# Patient Record
Sex: Female | Born: 1990 | Race: White | Hispanic: No | Marital: Married | Smoking: Current every day smoker
Health system: Southern US, Community
[De-identification: ages and names within clinical notes are randomized; demographics above are authoritative.]

## PROBLEM LIST (undated history)

## (undated) DIAGNOSIS — T7840XA Allergy, unspecified, initial encounter: Secondary | ICD-10-CM

## (undated) DIAGNOSIS — F419 Anxiety disorder, unspecified: Secondary | ICD-10-CM

## (undated) DIAGNOSIS — K219 Gastro-esophageal reflux disease without esophagitis: Secondary | ICD-10-CM

## (undated) DIAGNOSIS — J189 Pneumonia, unspecified organism: Secondary | ICD-10-CM

## (undated) DIAGNOSIS — F32A Depression, unspecified: Secondary | ICD-10-CM

## (undated) DIAGNOSIS — Z8701 Personal history of pneumonia (recurrent): Secondary | ICD-10-CM

## (undated) DIAGNOSIS — I499 Cardiac arrhythmia, unspecified: Secondary | ICD-10-CM

## (undated) DIAGNOSIS — I498 Other specified cardiac arrhythmias: Secondary | ICD-10-CM

## (undated) HISTORY — DX: Gastro-esophageal reflux disease without esophagitis: K21.9

## (undated) HISTORY — DX: Allergy, unspecified, initial encounter: T78.40XA

## (undated) HISTORY — DX: Depression, unspecified: F32.A

---
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
## 2016-03-29 DIAGNOSIS — R002 Palpitations: Secondary | ICD-10-CM | POA: Diagnosis not present

## 2016-03-29 DIAGNOSIS — R0789 Other chest pain: Secondary | ICD-10-CM | POA: Diagnosis not present

## 2016-03-29 DIAGNOSIS — R079 Chest pain, unspecified: Secondary | ICD-10-CM | POA: Diagnosis not present

## 2016-03-29 DIAGNOSIS — R071 Chest pain on breathing: Secondary | ICD-10-CM | POA: Diagnosis not present

## 2016-03-29 DIAGNOSIS — R5383 Other fatigue: Secondary | ICD-10-CM | POA: Diagnosis not present

## 2016-04-13 DIAGNOSIS — D72829 Elevated white blood cell count, unspecified: Secondary | ICD-10-CM | POA: Diagnosis not present

## 2016-04-13 DIAGNOSIS — R799 Abnormal finding of blood chemistry, unspecified: Secondary | ICD-10-CM | POA: Diagnosis not present

## 2016-05-10 DIAGNOSIS — R002 Palpitations: Secondary | ICD-10-CM | POA: Diagnosis not present

## 2016-08-10 DIAGNOSIS — R002 Palpitations: Secondary | ICD-10-CM | POA: Diagnosis not present

## 2016-08-11 DIAGNOSIS — R002 Palpitations: Secondary | ICD-10-CM | POA: Diagnosis not present

## 2016-10-25 DIAGNOSIS — M79672 Pain in left foot: Secondary | ICD-10-CM | POA: Diagnosis not present

## 2016-11-14 DIAGNOSIS — H9201 Otalgia, right ear: Secondary | ICD-10-CM | POA: Diagnosis not present

## 2016-12-28 DIAGNOSIS — D2239 Melanocytic nevi of other parts of face: Secondary | ICD-10-CM | POA: Diagnosis not present

## 2016-12-28 DIAGNOSIS — D225 Melanocytic nevi of trunk: Secondary | ICD-10-CM | POA: Diagnosis not present

## 2016-12-28 DIAGNOSIS — L821 Other seborrheic keratosis: Secondary | ICD-10-CM | POA: Diagnosis not present

## 2016-12-28 DIAGNOSIS — L820 Inflamed seborrheic keratosis: Secondary | ICD-10-CM | POA: Diagnosis not present

## 2017-02-27 DIAGNOSIS — J069 Acute upper respiratory infection, unspecified: Secondary | ICD-10-CM | POA: Diagnosis not present

## 2017-03-21 DIAGNOSIS — L820 Inflamed seborrheic keratosis: Secondary | ICD-10-CM | POA: Diagnosis not present

## 2017-03-22 DIAGNOSIS — Z01419 Encounter for gynecological examination (general) (routine) without abnormal findings: Secondary | ICD-10-CM | POA: Diagnosis not present

## 2017-05-23 DIAGNOSIS — J0100 Acute maxillary sinusitis, unspecified: Secondary | ICD-10-CM | POA: Diagnosis not present

## 2017-07-04 DIAGNOSIS — D2239 Melanocytic nevi of other parts of face: Secondary | ICD-10-CM | POA: Diagnosis not present

## 2017-07-04 DIAGNOSIS — D225 Melanocytic nevi of trunk: Secondary | ICD-10-CM | POA: Diagnosis not present

## 2017-07-04 DIAGNOSIS — L814 Other melanin hyperpigmentation: Secondary | ICD-10-CM | POA: Diagnosis not present

## 2017-12-26 DIAGNOSIS — R05 Cough: Secondary | ICD-10-CM | POA: Diagnosis not present

## 2017-12-26 DIAGNOSIS — J208 Acute bronchitis due to other specified organisms: Secondary | ICD-10-CM | POA: Diagnosis not present

## 2018-01-16 DIAGNOSIS — L814 Other melanin hyperpigmentation: Secondary | ICD-10-CM | POA: Diagnosis not present

## 2018-01-16 DIAGNOSIS — D2239 Melanocytic nevi of other parts of face: Secondary | ICD-10-CM | POA: Diagnosis not present

## 2018-01-16 DIAGNOSIS — D225 Melanocytic nevi of trunk: Secondary | ICD-10-CM | POA: Diagnosis not present

## 2018-01-16 DIAGNOSIS — D485 Neoplasm of uncertain behavior of skin: Secondary | ICD-10-CM | POA: Diagnosis not present

## 2018-01-19 DIAGNOSIS — D225 Melanocytic nevi of trunk: Secondary | ICD-10-CM | POA: Diagnosis not present

## 2018-01-30 DIAGNOSIS — D485 Neoplasm of uncertain behavior of skin: Secondary | ICD-10-CM | POA: Diagnosis not present

## 2018-03-14 DIAGNOSIS — D485 Neoplasm of uncertain behavior of skin: Secondary | ICD-10-CM | POA: Diagnosis not present

## 2018-04-18 DIAGNOSIS — J018 Other acute sinusitis: Secondary | ICD-10-CM | POA: Diagnosis not present

## 2018-05-16 DIAGNOSIS — J060 Acute laryngopharyngitis: Secondary | ICD-10-CM | POA: Diagnosis not present

## 2018-05-30 DIAGNOSIS — D485 Neoplasm of uncertain behavior of skin: Secondary | ICD-10-CM | POA: Diagnosis not present

## 2018-06-07 DIAGNOSIS — D485 Neoplasm of uncertain behavior of skin: Secondary | ICD-10-CM | POA: Diagnosis not present

## 2018-07-10 DIAGNOSIS — D225 Melanocytic nevi of trunk: Secondary | ICD-10-CM | POA: Diagnosis not present

## 2018-07-10 DIAGNOSIS — D2239 Melanocytic nevi of other parts of face: Secondary | ICD-10-CM | POA: Diagnosis not present

## 2018-07-10 DIAGNOSIS — L700 Acne vulgaris: Secondary | ICD-10-CM | POA: Diagnosis not present

## 2019-01-13 ENCOUNTER — Other Ambulatory Visit: Payer: Self-pay

## 2019-01-13 ENCOUNTER — Observation Stay (HOSPITAL_COMMUNITY)
Admission: EM | Admit: 2019-01-13 | Discharge: 2019-01-14 | Disposition: A | Discharge status: Home / Self Care | Payer: 59 | Attending: Surgery | Admitting: Surgery

## 2019-01-13 ENCOUNTER — Emergency Department (HOSPITAL_COMMUNITY): Payer: 59

## 2019-01-13 ENCOUNTER — Encounter (HOSPITAL_COMMUNITY): Payer: Self-pay

## 2019-01-13 DIAGNOSIS — K8010 Calculus of gallbladder with chronic cholecystitis without obstruction: Secondary | ICD-10-CM | POA: Diagnosis present

## 2019-01-13 DIAGNOSIS — F17210 Nicotine dependence, cigarettes, uncomplicated: Secondary | ICD-10-CM | POA: Insufficient documentation

## 2019-01-13 DIAGNOSIS — Z20828 Contact with and (suspected) exposure to other viral communicable diseases: Secondary | ICD-10-CM | POA: Insufficient documentation

## 2019-01-13 DIAGNOSIS — K8063 Calculus of gallbladder and bile duct with acute cholecystitis with obstruction: Secondary | ICD-10-CM

## 2019-01-13 DIAGNOSIS — R1011 Right upper quadrant pain: Secondary | ICD-10-CM | POA: Diagnosis present

## 2019-01-13 DIAGNOSIS — Z885 Allergy status to narcotic agent status: Secondary | ICD-10-CM | POA: Insufficient documentation

## 2019-01-13 DIAGNOSIS — Z881 Allergy status to other antibiotic agents status: Secondary | ICD-10-CM | POA: Diagnosis not present

## 2019-01-13 DIAGNOSIS — K819 Cholecystitis, unspecified: Secondary | ICD-10-CM

## 2019-01-13 DIAGNOSIS — K8012 Calculus of gallbladder with acute and chronic cholecystitis without obstruction: Secondary | ICD-10-CM | POA: Diagnosis not present

## 2019-01-13 HISTORY — DX: Anxiety disorder, unspecified: F41.9

## 2019-01-13 HISTORY — DX: Personal history of pneumonia (recurrent): Z87.01

## 2019-01-13 HISTORY — DX: Cardiac arrhythmia, unspecified: I49.9

## 2019-01-13 HISTORY — DX: Other specified cardiac arrhythmias: I49.8

## 2019-01-13 HISTORY — DX: Pneumonia, unspecified organism: J18.9

## 2019-01-13 LAB — LIPASE, BLOOD: Lipase: 24 U/L (ref 11–51)

## 2019-01-13 LAB — COMPREHENSIVE METABOLIC PANEL
ALT: 21 U/L (ref 0–44)
AST: 18 U/L (ref 15–41)
Albumin: 4 g/dL (ref 3.5–5.0)
Alkaline Phosphatase: 79 U/L (ref 38–126)
Anion gap: 13 (ref 5–15)
BUN: 11 mg/dL (ref 6–20)
CO2: 18 mmol/L — ABNORMAL LOW (ref 22–32)
Calcium: 9.5 mg/dL (ref 8.9–10.3)
Chloride: 108 mmol/L (ref 98–111)
Creatinine, Ser: 0.68 mg/dL (ref 0.44–1.00)
GFR calc Af Amer: >60 mL/min (ref >60)
GFR calc non Af Amer: >60 mL/min (ref >60)
Glucose, Bld: 101 mg/dL — ABNORMAL HIGH (ref 70–99)
Potassium: 4.2 mmol/L (ref 3.5–5.1)
Sodium: 139 mmol/L (ref 135–145)
Total Bilirubin: 0.4 mg/dL (ref 0.3–1.2)
Total Protein: 6.7 g/dL (ref 6.5–8.1)

## 2019-01-13 LAB — I-STAT BETA HCG BLOOD, ED (MC, WL, AP ONLY): I-stat hCG, quantitative: <5 m[IU]/mL (ref <5)

## 2019-01-13 LAB — CBC
HCT: 41.4 % (ref 36.0–46.0)
Hemoglobin: 13.8 g/dL (ref 12.0–15.0)
MCH: 30.6 pg (ref 26.0–34.0)
MCHC: 33.3 g/dL (ref 30.0–36.0)
MCV: 91.8 fL (ref 80.0–100.0)
Platelets: 345 10*3/uL (ref 150–400)
RBC: 4.51 MIL/uL (ref 3.87–5.11)
RDW: 13 % (ref 11.5–15.5)
WBC: 15.5 10*3/uL — ABNORMAL HIGH (ref 4.0–10.5)
nRBC: 0 % (ref 0.0–0.2)

## 2019-01-13 LAB — SURGICAL PCR SCREEN
MRSA, PCR: NEGATIVE
Staphylococcus aureus: NEGATIVE

## 2019-01-13 LAB — SARS CORONAVIRUS 2 (TAT 6-24 HRS): SARS Coronavirus 2: NEGATIVE

## 2019-01-13 MED ORDER — SODIUM CHLORIDE 0.9% FLUSH: 3.0000 mL | Freq: Once | INTRAVENOUS | Status: DC

## 2019-01-13 MED ORDER — ZOLPIDEM TARTRATE 5 MG PO TABS: 5.0000 mg | ORAL_TABLET | Freq: Every evening | ORAL | Status: DC | PRN

## 2019-01-13 MED ORDER — MORPHINE SULFATE (PF) 4 MG/ML IV SOLN: 4.0000 mg | INTRAVENOUS | Status: DC | PRN

## 2019-01-13 MED ORDER — ONDANSETRON 4 MG PO TBDP: 4.0000 mg | ORAL_TABLET | Freq: Four times a day (QID) | ORAL | Status: DC | PRN

## 2019-01-13 MED ORDER — PIPERACILLIN-TAZOBACTAM 3.375 G IVPB 30 MIN
3.3750 g | Freq: Once | INTRAVENOUS | Status: AC
  Administered 2019-01-13: 12:00:00 3.375 g via INTRAVENOUS
  Filled 2019-01-13: qty 50

## 2019-01-13 MED ORDER — KCL IN DEXTROSE-NACL 20-5-0.45 MEQ/L-%-% IV SOLN
INTRAVENOUS | Status: DC
  Administered 2019-01-13 (×2): via INTRAVENOUS
  Filled 2019-01-13 (×3): qty 1000

## 2019-01-13 MED ORDER — ACETAMINOPHEN 650 MG RE SUPP: 650.0000 mg | Freq: Four times a day (QID) | RECTAL | Status: DC | PRN

## 2019-01-13 MED ORDER — ACETAMINOPHEN 325 MG PO TABS: 650.0000 mg | ORAL_TABLET | Freq: Four times a day (QID) | ORAL | Status: DC | PRN

## 2019-01-13 MED ORDER — DIPHENHYDRAMINE HCL 50 MG/ML IJ SOLN: 25.0000 mg | Freq: Four times a day (QID) | INTRAMUSCULAR | Status: DC | PRN

## 2019-01-13 MED ORDER — ALPRAZOLAM 0.5 MG PO TABS
0.5000 mg | ORAL_TABLET | Freq: Three times a day (TID) | ORAL | Status: DC | PRN
  Administered 2019-01-13 (×2): 0.5 mg via ORAL
  Filled 2019-01-13 (×2): qty 1

## 2019-01-13 MED ORDER — DIPHENHYDRAMINE HCL 25 MG PO CAPS: 25.0000 mg | ORAL_CAPSULE | Freq: Four times a day (QID) | ORAL | Status: DC | PRN

## 2019-01-13 MED ORDER — ONDANSETRON HCL 4 MG/2ML IJ SOLN
4.0000 mg | Freq: Four times a day (QID) | INTRAMUSCULAR | Status: DC | PRN
  Administered 2019-01-14: 4 mg via INTRAVENOUS

## 2019-01-13 MED ORDER — CIPROFLOXACIN IN D5W 400 MG/200ML IV SOLN
400.0000 mg | Freq: Two times a day (BID) | INTRAVENOUS | Status: DC
  Administered 2019-01-13 – 2019-01-14 (×3): 400 mg via INTRAVENOUS
  Filled 2019-01-13 (×3): qty 200

## 2019-01-13 MED ORDER — IBUPROFEN 400 MG PO TABS: 400.0000 mg | ORAL_TABLET | Freq: Four times a day (QID) | ORAL | Status: DC | PRN

## 2019-01-13 NOTE — ED Provider Notes (Signed)
[facility] EMERGENCY DEPARTMENT Provider Note   CSN: [id number] Arrival date & time: 01/13/19  [id number]    History   Chief Complaint Gallbladder issue   HPI Yvonne Crawford is a 28 y.o. female with no significant past medical history who presents for evaluation of abdominal pain.  Patient states yesterday she developed chest and right-sided abdominal pain.  She was seen at [facility] yesterday night and dc this morning.  She was told that she could possibly have a thickened gallbladder wall, concern for cholecystitis.  Did not have ultrasound tech to perform an ultrasound at that time.  They asked her to return this morning for an official ultrasound to rule out cholecystitis.  Patient states she did not want to have surgery at [facility] so she presented here to the emergency department.  Patient states she did have an elevated white count of 17 at [facility].  Patient states her pain is been well controlled since she left the hospital.  Her current pain is a 2/10.  She denies fever, chills, nausea, vomiting, diarrhea, dysuria.  Denies additional aggravating or alleviating factors.  No prior abdominal surgeries Last p.o. intake was sip of water at 9 AM.  Last solid food yesterday evening.  History obtained from patient and past medical records.  No interpreter is used.     HPI  History reviewed. No pertinent past medical history.  Patient Active Problem List   Diagnosis Date Noted  . Cholecystitis with cholelithiasis 01/13/2019    History reviewed. No pertinent surgical history.   OB History   No obstetric history on file.      Home Medications    Prior to Admission medications   Not on File    Family History No family history on file.  Social History Social History   Tobacco Use  . Smoking status: Not on file  Substance Use Topics  . Alcohol use: Not on file  . Drug use: Not on file     Allergies   Patient has no  allergy information on record.   Review of Systems Review of Systems  Constitutional: Negative.   HENT: Negative.   Respiratory: Negative.   Cardiovascular: Negative.   Gastrointestinal: Positive for abdominal pain. Negative for abdominal distention, anal bleeding, blood in stool, constipation, diarrhea, nausea, rectal pain and vomiting.  Genitourinary: Negative.   Musculoskeletal: Negative.   Skin: Negative.   Neurological: Negative.   All other systems reviewed and are negative.    Physical Exam Updated Vital Signs BP 108/61   Pulse 65   Temp 98 F (36.7 C)   Resp 12   SpO2 97%   Physical Exam Vitals signs and nursing note reviewed.  Constitutional:      General: She is not in acute distress.    Appearance: She is well-developed. She is not ill-appearing or toxic-appearing.  HENT:     Head: Normocephalic and atraumatic.     Nose: Nose normal.     Mouth/Throat:     Mouth: Mucous membranes are moist.     Pharynx: Oropharynx is clear.  Eyes:     Pupils: Pupils are equal, round, and reactive to light.  Neck:     Musculoskeletal: Normal range of motion.  Cardiovascular:     Rate and Rhythm: Normal rate.     Pulses: Normal pulses.     Heart sounds: Normal heart sounds.  Pulmonary:     Effort: Pulmonary effort is normal. No respiratory distress.  Breath sounds: Normal breath sounds.  Abdominal:     General: Bowel sounds are normal. There is no distension.     Palpations: Abdomen is soft.     Tenderness: There is abdominal tenderness in the right upper quadrant and epigastric area. There is no right CVA tenderness, guarding or rebound. Negative signs include Murphy's sign.     Hernia: No hernia is present.     Comments: Soft, normoactive bowel sounds.  She does have some mild tenderness to her right upper quadrant however negative Murphy sign.  No rebound or guarding  Musculoskeletal: Normal range of motion.  Skin:    General: Skin is warm and dry.   Neurological:     Mental Status: She is alert.    ED Treatments / Results  Labs (all labs ordered are listed, but only abnormal results are displayed) Labs Reviewed  COMPREHENSIVE METABOLIC PANEL - Abnormal; Notable for the following components:      Result Value   CO2 18 (*)    Glucose, Bld 101 (*)    All other components within normal limits  CBC - Abnormal; Notable for the following components:   WBC 15.5 (*)    All other components within normal limits  SARS CORONAVIRUS 2 (TAT 6-24 HRS)  LIPASE, BLOOD  URINALYSIS, ROUTINE W REFLEX MICROSCOPIC  I-STAT BETA HCG BLOOD, ED ([facility], [facility], [facility] ONLY)    EKG None  Radiology US Abdomen Limited Ruq  Result Date: 01/13/2019 CLINICAL DATA:  Right upper quadrant pain since yesterday. EXAM: ULTRASOUND ABDOMEN LIMITED RIGHT UPPER QUADRANT COMPARISON:  None. FINDINGS: Gallbladder: Moderate cholelithiasis with largest stone measuring 1.6 cm. There is gallbladder wall thickening measuring 5.6 mm. Negative sonographic Murphy sign. No adjacent free fluid. Common bile duct: Diameter: 3.9 mm. Liver: No focal lesion identified. Within normal limits in parenchymal echogenicity. Portal vein is patent on color Doppler imaging with normal direction of blood flow towards the liver. Other: None. IMPRESSION: Moderate cholelithiasis with gallbladder wall thickening as these findings may be seen with acute cholecystitis as recommend clinical correlation. Electronically Signed   By: Marin Olp M.D.   On: 01/13/2019 10:56   Procedures Procedures (including critical care time)  Medications Ordered in ED Medications  sodium chloride flush (NS) 0.9 % injection 3 mL (0 mLs Intravenous Hold 01/13/19 1012)  piperacillin-tazobactam (ZOSYN) IVPB 3.375 g (3.375 g Intravenous New Bag/Given 01/13/19 1142)  morphine 4 MG/ML injection 4 mg (has no administration in time range)    Initial Impression / Assessment and Plan / ED Course  I have reviewed the triage vital signs  and the nursing notes.  Pertinent labs & imaging results that were available during my care of the patient were reviewed by me and considered in my medical decision making (see chart for details).  28 year old female appears otherwise well presents for evaluation abdominal pain.  Afebrile, nonseptic, non-ill-appearing.  Seen yesterday at [facility] and told she likely had cholecystitis however they cannot confirm this on her ultrasound.  They wanted her to return today for ultrasound however patient did not want surgery there so she presented to our emergency department.  She denies fever, chills.  Her pain was significantly relieved with the pain medicine prescribed at [facility] and her pain is actually currently only a 2/10 and she does not want any additional medicines.  She has a negative Murphy sign however does have some tenderness to her right upper quadrant.  She does have leukocytosis of 15.5.  Ultrasound does show  gallbladder wall thickening consistent with acute cholecystitis.    1108: Consulted with Dr. Grandville Silos with general surgery who will evaluate patient for admission. Plan to start IV abx. Plan for surgery tomorrow once COVID has resulted.  The patient appears reasonably stabilized for admission considering the current resources, flow, and capabilities available in the ED at this time, and I doubt any other [facility] requiring further screening and/or treatment in the ED prior to admission.       Final Clinical Impressions(s) / ED Diagnoses   Final diagnoses:  RUQ abdominal pain  Cholecystitis    ED Discharge Orders    None       Cieara Stierwalt A, PA-C 01/13/19 1211    Blanchie Dessert, MD 01/13/19 1442

## 2019-01-13 NOTE — ED Triage Notes (Signed)
Patient seen earlier this am at [facility] ED for epigastric pain. States that she was told gallbladder problem and unable to have offical u/s since radiology unavailable

## 2019-01-13 NOTE — ED Notes (Signed)
Patient transported to Ultrasound 

## 2019-01-13 NOTE — H&P (Signed)
Yvonne Crawford is an 28 y.o. female.   Chief Complaint: Right upper quadrant abdominal pain HPI: 28 year old female developed right upper quadrant abdominal pain overnight last night.  She went to [facility].  They suspected cholecystitis but they did not have ultrasound available.  If she was going to need surgery, she wanted that to be done at [facility] so she came to the [facility] emergency department this morning.  Work-up included ultrasound showing liver function test and lipase are normal, ultrasound shows gallstones and gallbladder wall thickening.  I was asked to see her for admission.  Her pain has gone down some since she received pain medication.  She denies recent illnesses or sick contacts.  History reviewed. No pertinent past medical history.  History reviewed. No pertinent surgical history.  No family history on file. Social History:  has no history on file for tobacco, alcohol, and drug.  Allergies: Not on File  (Not in a hospital admission)   Results for orders placed or performed during the hospital encounter of 01/13/19 (from the past 48 hour(s))  Lipase, blood     Status: None   Collection Time: 01/13/19  9:35 AM  Result Value Ref Range   Lipase 24 11 - 51 U/L    Comment: Performed at [facility], [address] [place], [place] [address]  Comprehensive metabolic panel     Status: Abnormal   Collection Time: 01/13/19  9:35 AM  Result Value Ref Range   Sodium 139 135 - 145 mmol/L   Potassium 4.2 3.5 - 5.1 mmol/L   Chloride 108 98 - 111 mmol/L   CO2 18 (L) 22 - 32 mmol/L   Glucose, Bld 101 (H) 70 - 99 mg/dL   BUN 11 6 - 20 mg/dL   Creatinine, Ser 0.68 0.44 - 1.00 mg/dL   Calcium 9.5 8.9 - 10.3 mg/dL   Total Protein 6.7 6.5 - 8.1 g/dL   Albumin 4.0 3.5 - 5.0 g/dL   AST 18 15 - 41 U/L   ALT 21 0 - 44 U/L   Alkaline Phosphatase 79 38 - 126 U/L   Total Bilirubin 0.4 0.3 - 1.2 mg/dL   GFR calc non Af Amer >60 >60 mL/min   GFR calc Af Amer >60 >60 mL/min    Anion gap 13 5 - 15    Comment: Performed at [facility], [place] [address], [place], [place] [address]  CBC     Status: Abnormal   Collection Time: 01/13/19  9:35 AM  Result Value Ref Range   WBC 15.5 (H) 4.0 - 10.5 K/uL   RBC 4.51 3.87 - 5.11 MIL/uL   Hemoglobin 13.8 12.0 - 15.0 g/dL   HCT 41.4 36.0 - 46.0 %   MCV 91.8 80.0 - 100.0 fL   MCH 30.6 26.0 - 34.0 pg   MCHC 33.3 30.0 - 36.0 g/dL   RDW 13.0 11.5 - 15.5 %   Platelets 345 150 - 400 K/uL   nRBC 0.0 0.0 - 0.2 %    Comment: Performed at [facility], [place] [address] [place], [place] [address]  I-Stat beta hCG blood, ED     Status: None   Collection Time: 01/13/19  9:52 AM  Result Value Ref Range   I-stat hCG, quantitative <5.0 <5 mIU/mL   Comment 3            Comment:   GEST. AGE      CONC.  (mIU/mL)   <=1  WEEK        5 - 50     2 WEEKS       50 - 500     3 WEEKS       100 - 10,000     4 WEEKS     1,000 - 30,000        FEMALE AND NON-PREGNANT FEMALE:     LESS THAN 5 mIU/mL    US Abdomen Limited Ruq  Result Date: 01/13/2019 CLINICAL DATA:  Right upper quadrant pain since yesterday. EXAM: ULTRASOUND ABDOMEN LIMITED RIGHT UPPER QUADRANT COMPARISON:  None. FINDINGS: Gallbladder: Moderate cholelithiasis with largest stone measuring 1.6 cm. There is gallbladder wall thickening measuring 5.6 mm. Negative sonographic Murphy sign. No adjacent free fluid. Common bile duct: Diameter: 3.9 mm. Liver: No focal lesion identified. Within normal limits in parenchymal echogenicity. Portal vein is patent on color Doppler imaging with normal direction of blood flow towards the liver. Other: None. IMPRESSION: Moderate cholelithiasis with gallbladder wall thickening as these findings may be seen with acute cholecystitis as recommend clinical correlation. Electronically Signed   By: Marin Olp M.D.   On: 01/13/2019 10:56    Review of Systems  Constitutional: Negative.   HENT: Negative.   Eyes: Negative.   Respiratory: Negative  for cough and shortness of breath.   Cardiovascular: Negative for chest pain.  Gastrointestinal: Positive for abdominal pain and nausea. Negative for constipation, diarrhea and vomiting.  Genitourinary: Negative.   Musculoskeletal: Negative.   Skin: Negative.   Neurological: Negative.   Endo/Heme/Allergies: Negative.   Psychiatric/Behavioral: Negative.     Blood pressure 116/82, pulse 75, temperature 98 F (36.7 C), resp. rate 12, SpO2 98 %. Physical Exam  Constitutional: She is oriented to person, place, and time. She appears well-developed and well-nourished. No distress.  HENT:  Head: Normocephalic.  Left Ear: External ear normal.  Nose: Nose normal.  Mouth/Throat: Oropharynx is clear and moist.  Eyes: Pupils are equal, round, and reactive to light. No scleral icterus.  Neck: Neck supple. No tracheal deviation present.  Cardiovascular: Normal rate, regular rhythm and normal heart sounds.  Respiratory: Effort normal and breath sounds normal. No respiratory distress. She has no wheezes. She has no rales.  GI: Soft. She exhibits no distension. There is abdominal tenderness. There is no rebound and no guarding.  Mild tenderness right upper quadrant  Musculoskeletal: Normal range of motion.        General: No edema.  Neurological: She is alert and oriented to person, place, and time.  Skin: Skin is warm.  Psychiatric: She has a normal mood and affect.     Assessment/Plan Cholecystitis with cholelithiasis -we will admit, check Covid test, will give IV Cipro as she reports an allergy to Rocephin.  Allow clears now and n.p.o. at midnight.  Plan laparoscopic cholecystectomy with possible cholangiogram tomorrow by Dr. Brantley Stage.  I discussed the procedure, risks, and benefits in detail with her.  She is agreeable.  Zenovia Jarred, MD 01/13/2019, 11:46 AM

## 2019-01-13 NOTE — ED Triage Notes (Signed)
PT was Dx with Gall stones at [facility] last night. Pt was instructed to return this AM when There was a radiologist present to read CT . Pt reported she may need surgery and wanted to come to [facility] surgery was needed.

## 2019-01-14 ENCOUNTER — Observation Stay (HOSPITAL_COMMUNITY): Payer: 59 | Admitting: Anesthesiology

## 2019-01-14 ENCOUNTER — Observation Stay (HOSPITAL_COMMUNITY): Payer: 59

## 2019-01-14 ENCOUNTER — Encounter (HOSPITAL_COMMUNITY): Payer: Self-pay | Admitting: *Deleted

## 2019-01-14 ENCOUNTER — Encounter (HOSPITAL_COMMUNITY)
Admission: EM | Disposition: A | Discharge status: Home / Self Care | Payer: Self-pay | Source: Home / Self Care | Attending: Emergency Medicine

## 2019-01-14 HISTORY — PX: CHOLECYSTECTOMY: SHX55

## 2019-01-14 HISTORY — PX: INTRAOPERATIVE CHOLANGIOGRAM: SHX5230

## 2019-01-14 LAB — CBC
HCT: 38.9 % (ref 36.0–46.0)
Hemoglobin: 12.7 g/dL (ref 12.0–15.0)
MCH: 30.7 pg (ref 26.0–34.0)
MCHC: 32.6 g/dL (ref 30.0–36.0)
MCV: 94 fL (ref 80.0–100.0)
Platelets: 287 10*3/uL (ref 150–400)
RBC: 4.14 MIL/uL (ref 3.87–5.11)
RDW: 13.2 % (ref 11.5–15.5)
WBC: 10.6 10*3/uL — ABNORMAL HIGH (ref 4.0–10.5)
nRBC: 0 % (ref 0.0–0.2)

## 2019-01-14 LAB — BASIC METABOLIC PANEL
Anion gap: 8 (ref 5–15)
BUN: <5 mg/dL — ABNORMAL LOW (ref 6–20)
CO2: 23 mmol/L (ref 22–32)
Calcium: 8.8 mg/dL — ABNORMAL LOW (ref 8.9–10.3)
Chloride: 107 mmol/L (ref 98–111)
Creatinine, Ser: 0.69 mg/dL (ref 0.44–1.00)
GFR calc Af Amer: >60 mL/min (ref >60)
GFR calc non Af Amer: >60 mL/min (ref >60)
Glucose, Bld: 109 mg/dL — ABNORMAL HIGH (ref 70–99)
Potassium: 4.2 mmol/L (ref 3.5–5.1)
Sodium: 138 mmol/L (ref 135–145)

## 2019-01-14 LAB — HIV ANTIBODY (ROUTINE TESTING W REFLEX): HIV Screen 4th Generation wRfx: NONREACTIVE

## 2019-01-14 SURGERY — LAPAROSCOPIC CHOLECYSTECTOMY WITH INTRAOPERATIVE CHOLANGIOGRAM
Anesthesia: General | Site: Abdomen

## 2019-01-14 MED ORDER — HYDROMORPHONE HCL 1 MG/ML IJ SOLN
INTRAMUSCULAR | Status: AC
  Filled 2019-01-14: qty 1

## 2019-01-14 MED ORDER — 0.9 % SODIUM CHLORIDE (POUR BTL) OPTIME
TOPICAL | Status: DC | PRN
  Administered 2019-01-14: 09:00:00 1000 mL

## 2019-01-14 MED ORDER — HYDROMORPHONE HCL 1 MG/ML IJ SOLN
INTRAMUSCULAR | Status: AC
  Filled 2019-01-14: qty 0.5

## 2019-01-14 MED ORDER — ONDANSETRON HCL 4 MG/2ML IJ SOLN
INTRAMUSCULAR | Status: AC
  Filled 2019-01-14: qty 2

## 2019-01-14 MED ORDER — MEPERIDINE HCL 25 MG/ML IJ SOLN: 6.2500 mg | INTRAMUSCULAR | Status: DC | PRN

## 2019-01-14 MED ORDER — SUGAMMADEX SODIUM 200 MG/2ML IV SOLN
INTRAVENOUS | Status: DC | PRN
  Administered 2019-01-14: 190 mg via INTRAVENOUS

## 2019-01-14 MED ORDER — LIDOCAINE 2% (20 MG/ML) 5 ML SYRINGE
INTRAMUSCULAR | Status: DC | PRN
  Administered 2019-01-14: 100 mg via INTRAVENOUS

## 2019-01-14 MED ORDER — DEXAMETHASONE SODIUM PHOSPHATE 10 MG/ML IJ SOLN
INTRAMUSCULAR | Status: AC
  Filled 2019-01-14: qty 1

## 2019-01-14 MED ORDER — TRAMADOL HCL 50 MG PO TABS: 50.0000 mg | ORAL_TABLET | Freq: Four times a day (QID) | ORAL | Status: DC | PRN

## 2019-01-14 MED ORDER — DEXAMETHASONE SODIUM PHOSPHATE 10 MG/ML IJ SOLN
INTRAMUSCULAR | Status: DC | PRN
  Administered 2019-01-14: 4 mg via INTRAVENOUS

## 2019-01-14 MED ORDER — PROPOFOL 10 MG/ML IV BOLUS
INTRAVENOUS | Status: AC
  Filled 2019-01-14: qty 20

## 2019-01-14 MED ORDER — HYDROMORPHONE HCL 1 MG/ML IJ SOLN
INTRAMUSCULAR | Status: DC | PRN
  Administered 2019-01-14 (×2): 0.5 mg via INTRAVENOUS

## 2019-01-14 MED ORDER — OXYCODONE HCL 5 MG PO TABS: 5.0000 mg | ORAL_TABLET | Freq: Four times a day (QID) | ORAL | 0 refills | Status: DC | PRN

## 2019-01-14 MED ORDER — LIDOCAINE 2% (20 MG/ML) 5 ML SYRINGE
INTRAMUSCULAR | Status: AC
  Filled 2019-01-14: qty 5

## 2019-01-14 MED ORDER — ROCURONIUM BROMIDE 10 MG/ML (PF) SYRINGE
PREFILLED_SYRINGE | INTRAVENOUS | Status: AC
  Filled 2019-01-14: qty 10

## 2019-01-14 MED ORDER — OXYCODONE HCL 5 MG PO TABS
5.0000 mg | ORAL_TABLET | ORAL | Status: DC | PRN
  Administered 2019-01-14: 10 mg via ORAL
  Filled 2019-01-14: qty 2

## 2019-01-14 MED ORDER — LACTATED RINGERS IV SOLN
Freq: Once | INTRAVENOUS | Status: AC
  Administered 2019-01-14: 08:00:00 via INTRAVENOUS

## 2019-01-14 MED ORDER — MORPHINE SULFATE (PF) 2 MG/ML IV SOLN
2.0000 mg | INTRAVENOUS | Status: DC | PRN
  Administered 2019-01-14: 2 mg via INTRAVENOUS

## 2019-01-14 MED ORDER — STERILE WATER FOR IRRIGATION IR SOLN
Status: DC | PRN
  Administered 2019-01-14: 1000 mL

## 2019-01-14 MED ORDER — PROMETHAZINE HCL 25 MG/ML IJ SOLN
INTRAMUSCULAR | Status: AC
  Filled 2019-01-14: qty 1

## 2019-01-14 MED ORDER — ENOXAPARIN SODIUM 40 MG/0.4ML ~~LOC~~ SOLN: 40.0000 mg | SUBCUTANEOUS | Status: DC

## 2019-01-14 MED ORDER — SODIUM CHLORIDE 0.9 % IV SOLN
INTRAVENOUS | Status: DC | PRN
  Administered 2019-01-14: 15 mL

## 2019-01-14 MED ORDER — BUPIVACAINE-EPINEPHRINE 0.5% -1:200000 IJ SOLN
INTRAMUSCULAR | Status: AC
  Filled 2019-01-14: qty 1

## 2019-01-14 MED ORDER — FENTANYL CITRATE (PF) 250 MCG/5ML IJ SOLN
INTRAMUSCULAR | Status: DC | PRN
  Administered 2019-01-14: 50 ug via INTRAVENOUS
  Administered 2019-01-14: 25 ug via INTRAVENOUS
  Administered 2019-01-14: 100 ug via INTRAVENOUS

## 2019-01-14 MED ORDER — PROPOFOL 10 MG/ML IV BOLUS
INTRAVENOUS | Status: DC | PRN
  Administered 2019-01-14: 200 mg via INTRAVENOUS

## 2019-01-14 MED ORDER — ACETAMINOPHEN 325 MG PO TABS: 650.0000 mg | ORAL_TABLET | Freq: Four times a day (QID) | ORAL | Status: DC | PRN

## 2019-01-14 MED ORDER — LACTATED RINGERS IV SOLN
INTRAVENOUS | Status: DC | PRN
  Administered 2019-01-14 (×2): via INTRAVENOUS

## 2019-01-14 MED ORDER — MIDAZOLAM HCL 2 MG/2ML IJ SOLN
INTRAMUSCULAR | Status: AC
  Filled 2019-01-14: qty 2

## 2019-01-14 MED ORDER — HYDROMORPHONE HCL 1 MG/ML IJ SOLN
0.2500 mg | INTRAMUSCULAR | Status: DC | PRN
  Administered 2019-01-14: 0.5 mg via INTRAVENOUS

## 2019-01-14 MED ORDER — IBUPROFEN 400 MG PO TABS: 400.0000 mg | ORAL_TABLET | Freq: Four times a day (QID) | ORAL | 0 refills | Status: DC | PRN

## 2019-01-14 MED ORDER — FENTANYL CITRATE (PF) 250 MCG/5ML IJ SOLN
INTRAMUSCULAR | Status: AC
  Filled 2019-01-14: qty 5

## 2019-01-14 MED ORDER — PROMETHAZINE HCL 25 MG/ML IJ SOLN
6.2500 mg | INTRAMUSCULAR | Status: AC | PRN
  Administered 2019-01-14 (×2): 6.25 mg via INTRAVENOUS

## 2019-01-14 MED ORDER — MIDAZOLAM HCL 5 MG/5ML IJ SOLN
INTRAMUSCULAR | Status: DC | PRN
  Administered 2019-01-14: 2 mg via INTRAVENOUS

## 2019-01-14 MED ORDER — MORPHINE SULFATE (PF) 2 MG/ML IV SOLN
INTRAVENOUS | Status: AC
  Filled 2019-01-14: qty 1

## 2019-01-14 MED ORDER — BUPIVACAINE HCL 0.5 % IJ SOLN
INTRAMUSCULAR | Status: DC | PRN
  Administered 2019-01-14: 12 mL

## 2019-01-14 MED ORDER — ROCURONIUM BROMIDE 10 MG/ML (PF) SYRINGE
PREFILLED_SYRINGE | INTRAVENOUS | Status: DC | PRN
  Administered 2019-01-14: 20 mg via INTRAVENOUS
  Administered 2019-01-14: 50 mg via INTRAVENOUS

## 2019-01-14 MED ORDER — SODIUM CHLORIDE 0.9 % IR SOLN
Status: DC | PRN
  Administered 2019-01-14: 1000 mL

## 2019-01-14 SURGICAL SUPPLY — 41 items
APPLIER CLIP ROT 10 11.4 M/L (STAPLE) ×3
BLADE CLIPPER SURG (BLADE) IMPLANT
CANISTER SUCT 3000ML PPV (MISCELLANEOUS) ×3 IMPLANT
CHLORAPREP W/TINT 26 (MISCELLANEOUS) ×3 IMPLANT
CLIP APPLIE ROT 10 11.4 M/L (STAPLE) ×1 IMPLANT
COVER MAYO STAND STRL (DRAPES) ×3 IMPLANT
COVER SURGICAL LIGHT HANDLE (MISCELLANEOUS) ×3 IMPLANT
COVER WAND RF STERILE (DRAPES) ×3 IMPLANT
DERMABOND ADVANCED (GAUZE/BANDAGES/DRESSINGS) ×2
DERMABOND ADVANCED .7 DNX12 (GAUZE/BANDAGES/DRESSINGS) ×1 IMPLANT
DRAPE C-ARM 42X120 X-RAY (DRAPES) ×3 IMPLANT
ELECT REM PT RETURN 9FT ADLT (ELECTROSURGICAL) ×3
ELECTRODE REM PT RTRN 9FT ADLT (ELECTROSURGICAL) ×1 IMPLANT
GLOVE BIO SURGEON STRL SZ7.5 (GLOVE) ×3 IMPLANT
GLOVE BIO SURGEON STRL SZ8 (GLOVE) ×3 IMPLANT
GLOVE BIOGEL PI IND STRL 8 (GLOVE) ×2 IMPLANT
GLOVE BIOGEL PI INDICATOR 8 (GLOVE) ×4
GOWN STRL REUS W/ TWL LRG LVL3 (GOWN DISPOSABLE) ×2 IMPLANT
GOWN STRL REUS W/ TWL XL LVL3 (GOWN DISPOSABLE) ×1 IMPLANT
GOWN STRL REUS W/TWL LRG LVL3 (GOWN DISPOSABLE) ×4
GOWN STRL REUS W/TWL XL LVL3 (GOWN DISPOSABLE) ×2
KIT BASIN OR (CUSTOM PROCEDURE TRAY) ×3 IMPLANT
KIT TURNOVER KIT B (KITS) ×3 IMPLANT
NS IRRIG 1000ML POUR BTL (IV SOLUTION) ×3 IMPLANT
PAD ARMBOARD 7.5X6 YLW CONV (MISCELLANEOUS) ×3 IMPLANT
POUCH RETRIEVAL ECOSAC 10 (ENDOMECHANICALS) ×1 IMPLANT
POUCH RETRIEVAL ECOSAC 10MM (ENDOMECHANICALS) ×2
SCISSORS LAP 5X35 DISP (ENDOMECHANICALS) ×3 IMPLANT
SET CHOLANGIOGRAPH 5 50 .035 (SET/KITS/TRAYS/PACK) ×3 IMPLANT
SET IRRIG TUBING LAPAROSCOPIC (IRRIGATION / IRRIGATOR) ×3 IMPLANT
SET TUBE SMOKE EVAC HIGH FLOW (TUBING) ×3 IMPLANT
SLEEVE ENDOPATH XCEL 5M (ENDOMECHANICALS) ×3 IMPLANT
SPECIMEN JAR SMALL (MISCELLANEOUS) ×3 IMPLANT
SUT MNCRL AB 4-0 PS2 18 (SUTURE) ×3 IMPLANT
TOWEL GREEN STERILE (TOWEL DISPOSABLE) ×3 IMPLANT
TOWEL GREEN STERILE FF (TOWEL DISPOSABLE) ×3 IMPLANT
TRAY LAPAROSCOPIC MC (CUSTOM PROCEDURE TRAY) ×3 IMPLANT
TROCAR XCEL BLUNT TIP 100MML (ENDOMECHANICALS) ×3 IMPLANT
TROCAR XCEL NON-BLD 11X100MML (ENDOMECHANICALS) ×3 IMPLANT
TROCAR XCEL NON-BLD 5MMX100MML (ENDOMECHANICALS) ×3 IMPLANT
WATER STERILE IRR 1000ML POUR (IV SOLUTION) ×3 IMPLANT

## 2019-01-14 NOTE — Transfer of Care (Signed)
Immediate Anesthesia Transfer of Care Note  Patient: Yvonne Crawford  Procedure(s) Performed: LAPAROSCOPIC CHOLECYSTECTOMY (N/A Abdomen) Intraoperative Cholangiogram (N/A Abdomen)  Patient Location: PACU  Anesthesia Type:General  Level of Consciousness: awake, alert  and oriented  Airway & Oxygen Therapy: Patient Spontanous Breathing and Patient connected to nasal cannula oxygen  Post-op Assessment: Report given to RN, Post -op Vital signs reviewed and stable and Patient moving all extremities  Post vital signs: Reviewed and stable  Last Vitals:  Vitals Value Taken Time  BP 118/83 01/14/19 1013  Temp    Pulse 95 01/14/19 1016  Resp 20 01/14/19 1016  SpO2 91 % 01/14/19 1016  Vitals shown include unvalidated device data.  Last Pain:  Vitals:   01/14/19 0504  TempSrc: Oral  PainSc:          Complications: No apparent anesthesia complications

## 2019-01-14 NOTE — Discharge Instructions (Signed)
[facility] ° °Please arrive at least 30 min before your appointment to complete your check in paperwork.  If you are unable to arrive 30 min prior to your appointment time we may have to cancel or reschedule you. °LAPAROSCOPIC SURGERY: POST OP INSTRUCTIONS °Always review your discharge instruction sheet given to you by the facility where your surgery was performed. °IF YOU HAVE DISABILITY OR FAMILY LEAVE FORMS, YOU MUST BRING THEM TO THE OFFICE FOR PROCESSING.   °DO NOT GIVE THEM TO YOUR DOCTOR. ° °PAIN CONTROL ° °1. First take acetaminophen (Tylenol) AND/or ibuprofen (Advil) to control your pain after surgery.  Follow directions on package.  Taking acetaminophen (Tylenol) and/or ibuprofen (Advil) regularly after surgery will help to control your pain and lower the amount of prescription pain medication you may need.  You should not take more than 4,000 mg (4 grams) of acetaminophen (Tylenol) in 24 hours.  You should not take ibuprofen (Advil), aleve, motrin, naprosyn or other NSAIDS if you have a history of stomach ulcers or chronic kidney disease.  °2. A prescription for pain medication may be given to you upon discharge.  Take your pain medication as prescribed, if you still have uncontrolled pain after taking acetaminophen (Tylenol) or ibuprofen (Advil). °3. Use ice packs to help control pain. °4. If you need a refill on your pain medication, please contact your pharmacy.  They will contact our office to request authorization. Prescriptions will not be filled after 5pm or on week-ends. ° °HOME MEDICATIONS °5. Take your usually prescribed medications unless otherwise directed. ° °DIET °6. You should follow a light diet the first few days after arrival home.  Be sure to include lots of fluids daily. Avoid fatty, fried foods.  ° °CONSTIPATION °7. It is common to experience some constipation after surgery and if you are taking pain medication.  Increasing fluid intake and taking a stool  softener (such as Colace) will usually help or prevent this problem from occurring.  A mild laxative (Milk of Magnesia or Miralax) should be taken according to package instructions if there are no bowel movements after 48 hours. ° °WOUND/INCISION CARE °8. Most patients will experience some swelling and bruising in the area of the incisions.  Ice packs will help.  Swelling and bruising can take several days to resolve.  °9. Unless discharge instructions indicate otherwise, follow guidelines below  °a. STERI-STRIPS - you may remove your outer bandages 48 hours after surgery, and you may shower at that time.  You have steri-strips (small skin tapes) in place directly over the incision.  These strips should be left on the skin for 7-10 days.   °b. DERMABOND/SKIN GLUE - you may shower in 24 hours.  The glue will flake off over the next 2-3 weeks. °10. Any sutures or staples will be removed at the office during your follow-up visit. ° °ACTIVITIES °11. You may resume regular (light) daily activities beginning the next day--such as daily self-care, walking, climbing stairs--gradually increasing activities as tolerated.  You may have sexual intercourse when it is comfortable.  Refrain from any heavy lifting or straining until approved by your doctor. °a. You may drive when you are no longer taking prescription pain medication, you can comfortably wear a seatbelt, and you can safely maneuver your car and apply brakes. ° °FOLLOW-UP °12. You should see your doctor in the office for a follow-up appointment approximately 2-3 weeks after your surgery.  You should have been given your post-op/follow-up appointment when   your surgery was scheduled.  If you did not receive a post-op/follow-up appointment, make sure that you call for this appointment within a day or two after you arrive home to insure a convenient appointment time. ° °OTHER INSTRUCTIONS ° °WHEN TO CALL YOUR DOCTOR: °1. Fever over 101.0 °2. Inability to  urinate °3. Continued bleeding from incision. °4. Increased pain, redness, or drainage from the incision. °5. Increasing abdominal pain ° °The clinic staff is available to answer your questions during regular business hours.  Please don’t hesitate to call and ask to speak to one of the nurses for clinical concerns.  If you have a medical emergency, go to the nearest emergency room or call 911.  A surgeon from [facility] is always on call at the hospital. °[address], [address], [place], [place]  [address] ? [address] [address], [place], [place]   [address] °[phone] ? [phone] ? FAX [phone] ° ° ° °

## 2019-01-14 NOTE — Progress Notes (Signed)
Yvonne Crawford to be D/C'd  per MD order. Discussed with the patient and all questions fully answered.  VSS, Skin clean, dry and intact without evidence of skin break down, no evidence of skin tears noted.  IV catheter discontinued intact. Site without signs and symptoms of complications. Dressing and pressure applied.  An After Visit Summary was printed and given to the patient. Patient received prescription.  D/c education completed with patient/family including follow up instructions, medication list, d/c activities limitations if indicated, with other d/c instructions as indicated by MD - patient able to verbalize understanding, all questions fully answered.   Patient instructed to return to ED, call 911, or call MD for any changes in condition.   Patient to be escorted via [place], and D/C home via private auto.

## 2019-01-14 NOTE — Anesthesia Procedure Notes (Signed)
Procedure Name: Intubation Date/Time: 01/14/2019 8:48 AM Performed by: Amadeo Garnet, CRNA Pre-anesthesia Checklist: Patient identified, Emergency Drugs available, Suction available and Patient being monitored Patient Re-evaluated:Patient Re-evaluated prior to induction Oxygen Delivery Method: Circle system utilized Preoxygenation: Pre-oxygenation with 100% oxygen Induction Type: IV induction Ventilation: Mask ventilation without difficulty Laryngoscope Size: Mac and 3 Grade View: Grade I Tube type: Oral Tube size: 7.0 mm Number of attempts: 1 Airway Equipment and Method: Stylet and Oral airway Placement Confirmation: ETT inserted through vocal cords under direct vision,  positive ETCO2 and breath sounds checked- equal and bilateral Secured at: 21 cm Tube secured with: Tape Dental Injury: Teeth and Oropharynx as per pre-operative assessment

## 2019-01-14 NOTE — Anesthesia Preprocedure Evaluation (Addendum)
Anesthesia Evaluation  Patient identified by MRN, date of birth, ID band Patient awake    Reviewed: Allergy & Precautions, NPO status , Patient's Chart, lab work & pertinent test results  Airway Mallampati: I  TM Distance: >3 FB Neck ROM: Full    Dental  (+) Dental Advisory Given, Edentulous Upper, Edentulous Lower   Pulmonary Current Smoker and Patient abstained from smoking.,    Pulmonary exam normal breath sounds clear to auscultation       Cardiovascular negative cardio ROS Normal cardiovascular exam Rhythm:Regular Rate:Normal     Neuro/Psych negative neurological ROS  negative psych ROS   GI/Hepatic negative GI ROS, Neg liver ROS,   Endo/Other  negative endocrine ROS  Renal/GU negative Renal ROS     Musculoskeletal negative musculoskeletal ROS (+)   Abdominal (+) + obese,   Peds  Hematology negative hematology ROS (+)   Anesthesia Other Findings   Reproductive/Obstetrics negative OB ROS                            Anesthesia Physical Anesthesia Plan  ASA: II  Anesthesia Plan: General   Post-op Pain Management:    Induction: Intravenous  PONV Risk Score and Plan: 4 or greater and Dexamethasone, Ondansetron, Midazolam and Treatment may vary due to age or medical condition  Airway Management Planned: Oral ETT  Additional Equipment: None  Intra-op Plan:   Post-operative Plan: Extubation in OR  Informed Consent: I have reviewed the patients History and Physical, chart, labs and discussed the procedure including the risks, benefits and alternatives for the proposed anesthesia with the patient or authorized representative who has indicated his/her understanding and acceptance.     Dental advisory given  Plan Discussed with: CRNA  Anesthesia Plan Comments:         Anesthesia Quick Evaluation

## 2019-01-14 NOTE — Discharge Summary (Signed)
[facility] Discharge Summary   Patient ID: Yvonne Crawford MRN: [id number] DOB/AGE: [birth year] 28 y.o.  Admit date: 01/13/2019 Discharge date: 01/14/2019  Admitting Diagnosis: Acute cholecystitis  Discharge Diagnosis Patient Active Problem List   Diagnosis Date Noted  . Cholecystitis with cholelithiasis 01/13/2019    Consultants None  Imaging: Dg Cholangiogram Operative  Result Date: 01/14/2019 CLINICAL DATA:  28 year old female with laparoscopic cholecystectomy for cholelithiasis EXAM: INTRAOPERATIVE CHOLANGIOGRAM TECHNIQUE: Cholangiographic images from the C-arm fluoroscopic device were submitted for interpretation post-operatively. Please see the procedural report for the amount of contrast and the fluoroscopy time utilized. COMPARISON:  None. FINDINGS: Surgical instruments project over the upper abdomen. There is cannulation of the cystic duct/gallbladder neck, with antegrade infusion of contrast. Caliber of the extrahepatic ductal system within normal limits. No definite filling defect within the extrahepatic ducts identified. Free flow of contrast across the ampulla. IMPRESSION: Intraoperative cholangiogram demonstrates extrahepatic biliary ducts of unremarkable caliber, with no definite filling defects identified. Free flow of contrast across the ampulla. Please refer to the dictated operative report for full details of intraoperative findings and procedure Electronically Signed   By: Corrie Mckusick D.O.   On: 01/14/2019 11:24   US Abdomen Limited Ruq  Result Date: 01/13/2019 CLINICAL DATA:  Right upper quadrant pain since yesterday. EXAM: ULTRASOUND ABDOMEN LIMITED RIGHT UPPER QUADRANT COMPARISON:  None. FINDINGS: Gallbladder: Moderate cholelithiasis with largest stone measuring 1.6 cm. There is gallbladder wall thickening measuring 5.6 mm. Negative sonographic Murphy sign. No adjacent free fluid. Common bile duct: Diameter: 3.9 mm. Liver: No focal lesion identified.  Within normal limits in parenchymal echogenicity. Portal vein is patent on color Doppler imaging with normal direction of blood flow towards the liver. Other: None. IMPRESSION: Moderate cholelithiasis with gallbladder wall thickening as these findings may be seen with acute cholecystitis as recommend clinical correlation. Electronically Signed   By: Marin Olp M.D.   On: 01/13/2019 10:56    Procedures Dr. Brantley Stage (01/14/19) - Laparoscopic Cholecystectomy with [place] Hospital Course:  Yvonne Crawford is a 28yo female who presented to [facility] 11/8 with acute onset RUQ pain.  Work-up included lab work showing liver function test and lipase are normal, ultrasound shows gallstones and gallbladder wall thickening.   Patient was admitted and underwent procedure listed above.  Tolerated procedure well and was transferred to the floor.  Diet was advanced as tolerated.  On POD0, the patient was voiding well, tolerating diet, ambulating well, pain well controlled, vital signs stable, incisions c/d/i and felt stable for discharge home.  Patient will follow up as below and knows to call with questions or concerns.    I have personally reviewed the patients medication history on the [place] controlled substance database.    Physical Exam: General:  Alert, NAD, pleasant, comfortable Pulm: rate and effort normal Abd:  Soft, ND, appropriately tender, multiple lap incisions C/D/I  Allergies as of 01/14/2019      Reactions   Percocet [oxycodone-acetaminophen]    Upset stomach   Rocephin [ceftriaxone] Hives      Medication List    TAKE these medications   acetaminophen 325 MG tablet Commonly known as: TYLENOL Take 2 tablets (650 mg total) by mouth every 6 (six) hours as needed for mild pain (or temp > 100).   ibuprofen 400 MG tablet Commonly known as: ADVIL Take 1 tablet (400 mg total) by mouth every 6 (six) hours as needed for moderate pain.   oxyCODONE 5 MG immediate release tablet Commonly known as: Oxy  IR/ROXICODONE Take 1 tablet (5 mg total) by mouth every 6 (six) hours as needed for severe pain.        Follow-up [facility], [place]. Call.   Specialty: General Surgery Why: We are working on your appointment, call to confirm. Please arrive 30 minutes prior to your appointment to check in and fill out paperwork. Bring photo ID and insurance information. Contact information: [address] [place] [phone]          Signed: Wellington [place], [facility] 01/14/2019, 3:01 PM Please see Amion for pager number during day hours 7:00am-4:30pm

## 2019-01-14 NOTE — Interval H&P Note (Signed)
History and Physical Interval Note:  01/14/2019 8:14 AM  Frances Furbish [place]pe  has presented today for surgery, with the diagnosis of cholecystitis.  The various methods of treatment have been discussed with the patient and family. After consideration of risks, benefits and other options for treatment, the patient has consented to  Procedure(s): LAPAROSCOPIC CHOLECYSTECTOMY WITH POSSIBLE INTRAOPERATIVE CHOLANGIOGRAM (N/A) as a surgical intervention.  The patient's history has been reviewed, patient examined, no change in status, stable for surgery.  I have reviewed the patient's chart and labs.  Questions were answered to the patient's satisfaction.    Pt seen examined and agree  The procedure has been discussed with the patient. Operative and non operative treatments have been discussed. Risks of surgery include bleeding, infection,  Common bile duct injury,  Injury to the stomach,liver, colon,small intestine, abdominal wall,  Diaphragm,  Major blood vessels,  And the need for an open procedure.  Other risks include worsening of medical problems, death,  DVT and pulmonary embolism, and cardiovascular events.   Medical options have also been discussed. The patient has been informed of long term expectations of surgery and non surgical options,  The patient agrees to proceed.   [place]

## 2019-01-14 NOTE — Anesthesia Postprocedure Evaluation (Signed)
Anesthesia Post Note  Patient: Yvonne Crawford  Procedure(s) Performed: LAPAROSCOPIC CHOLECYSTECTOMY (N/A Abdomen) Intraoperative Cholangiogram (N/A Abdomen)     Patient location during evaluation: PACU Anesthesia Type: General Level of consciousness: sedated and patient cooperative Pain management: pain level controlled Vital Signs Assessment: post-procedure vital signs reviewed and stable Respiratory status: spontaneous breathing Cardiovascular status: stable Anesthetic complications: no    Last Vitals:  Vitals:   01/14/19 1113 01/14/19 1130  BP: 132/76 124/73  Pulse: 71 81  Resp: 17 17  Temp: 36.4 C 36.4 C  SpO2: 91% 94%    Last Pain:  Vitals:   01/14/19 1143  TempSrc:   PainSc: [place]

## 2019-01-14 NOTE — Op Note (Signed)
Laparoscopic Cholecystectomy with IOC Procedure Note  Indications: This patient presents with symptomatic gallbladder disease and will undergo laparoscopic cholecystectomy.The procedure has been discussed with the patient. Operative and non operative treatments have been discussed. Risks of surgery include bleeding, infection,  Common bile duct injury,  Injury to the stomach,liver, colon,small intestine, abdominal wall,  Diaphragm,  Major blood vessels,  And the need for an open procedure.  Other risks include worsening of medical problems, death,  DVT and pulmonary embolism, and cardiovascular events.   Medical options have also been discussed. The patient has been informed of long term expectations of surgery and non surgical options,  The patient agrees to proceed.    Pre-operative Diagnosis: Calculus of gallbladder with acute cholecystitis, without mention of obstruction  Post-operative Diagnosis: Same  Surgeon: Turner Daniels  MD   Assistants: [place] PA-C  Anesthesia: General endotracheal anesthesia and Local anesthesia 0.25.% bupivacaine, with epinephrine  ASA Class: 1  Procedure Details  The patient was seen again in the Holding Room. The risks, benefits, complications, treatment options, and expected outcomes were discussed with the patient. The possibilities of reaction to medication, pulmonary aspiration, perforation of viscus, bleeding, recurrent infection, finding a normal gallbladder, the need for additional procedures, failure to diagnose a condition, the possible need to convert to an open procedure, and creating a complication requiring transfusion or operation were discussed with the patient. The patient and/or family concurred with the proposed plan, giving informed consent. The site of surgery properly noted/marked. The patient was taken to Operating Room, identified as Yvonne Crawford and the procedure verified as Laparoscopic Cholecystectomy with Intraoperative  Cholangiograms. A Time Out was held and the above information confirmed.  Prior to the induction of general anesthesia, antibiotic prophylaxis was administered. General endotracheal anesthesia was then administered and tolerated well. After the induction, the abdomen was prepped in the usual sterile fashion. The patient was positioned in the supine position with the left arm comfortably tucked, along with some reverse Trendelenburg.  Local anesthetic agent was injected into the skin near the umbilicus and an incision made. The midline fascia was incised and the Hasson technique was used to introduce a 12 mm port under direct vision. It was secured with a figure of eight Vicryl suture placed in the usual fashion. Pneumoperitoneum was then created with CO2 and tolerated well without any adverse changes in the patient's vital signs. Additional trocars were introduced under direct vision with an 11 mm trocar in the epigastrium and 2 5 mm trocars in the right upper quadrant. All skin incisions were infiltrated with a local anesthetic agent before making the incision and placing the trocars.   The gallbladder was identified, the fundus grasped and retracted cephalad. Adhesions were lysed bluntly and with the electrocautery where indicated, taking care not to injure any adjacent organs or viscus. The infundibulum was grasped and retracted laterally, exposing the peritoneum overlying the triangle of Calot. This was then divided and exposed in a blunt fashion. The cystic duct was clearly identified and bluntly dissected circumferentially. The junctions of the gallbladder, cystic duct and common bile duct were clearly identified prior to the division of any linear structure.   An incision was made in the cystic duct and the cholangiogram catheter introduced. The catheter was secured using an endoclip. The study showed no stones and good visualization of the distal and proximal biliary tree. The catheter was then  removed.   The cystic duct was then  ligated with surgical clips  on the  patient side and  clipped on the gallbladder side and divided. The cystic artery was identified, dissected free, ligated with clips and divided as well. Posterior cystic artery clipped and divided.  The gallbladder was dissected from the liver bed in retrograde fashion with the electrocautery. The gallbladder was removed. The liver bed was irrigated and inspected. Hemostasis was achieved with the electrocautery. Copious irrigation was utilized and was repeatedly aspirated until clear all particulate matter. Hemostasis was achieved with no signs  Of bleeding or bile leakage.  Pneumoperitoneum was completely reduced after viewing removal of the trocars under direct vision. The wound was thoroughly irrigated and the fascia was then closed with a figure of eight suture; the skin was then closed with 4 0 MONOCRYL  and a sterile dressing was applied.  Instrument, sponge, and needle counts were correct at closure and at the conclusion of the case.   Findings: Cholecystitis with Cholelithiasis  Estimated Blood Loss: less than 50 mL         Drains: none         Total IV Fluids: per record         Specimens: Gallbladder           Complications: None; patient tolerated the procedure well.         Disposition: PACU - hemodynamically stable.         Condition: stable

## 2019-01-14 NOTE — Plan of Care (Signed)
  Problem: Pain Managment: Goal: General experience of comfort will improve Outcome: Progressing   Problem: Safety: Goal: Ability to remain free from injury will improve Outcome: Progressing   Problem: Skin Integrity: Goal: Risk for impaired skin integrity will decrease Outcome: Progressing   

## 2019-01-15 ENCOUNTER — Encounter (HOSPITAL_COMMUNITY): Payer: Self-pay | Admitting: Surgery

## 2019-01-15 LAB — SURGICAL PATHOLOGY

## 2020-01-16 ENCOUNTER — Other Ambulatory Visit: Payer: Self-pay

## 2020-01-16 ENCOUNTER — Ambulatory Visit: Payer: 59 | Admitting: Nurse Practitioner

## 2020-01-16 ENCOUNTER — Encounter: Payer: Self-pay | Admitting: Nurse Practitioner

## 2020-01-16 VITALS — BP 128/72 | HR 66 | Temp 97.7°F | Ht 67.0 in | Wt 194.0 lb

## 2020-01-16 DIAGNOSIS — F17210 Nicotine dependence, cigarettes, uncomplicated: Secondary | ICD-10-CM | POA: Diagnosis not present

## 2020-01-16 DIAGNOSIS — J302 Other seasonal allergic rhinitis: Secondary | ICD-10-CM | POA: Diagnosis not present

## 2020-01-16 NOTE — Patient Instructions (Signed)
Take Zyrtec daily for seasonal allergies Call if symptoms worsen or fail to improve  Steps to Quit Smoking Smoking tobacco is the leading cause of preventable death. It can affect almost every organ in the body. Smoking puts you and people around you at risk for many serious, long-lasting (chronic) diseases. Quitting smoking can be hard, but it is one of the best things that you can do for your health. It is never too late to quit. How do I get ready to quit? When you decide to quit smoking, make a plan to help you succeed. Before you quit:  Pick a date to quit. Set a date within the next 2 weeks to give you time to prepare.  Write down the reasons why you are quitting. Keep this list in places where you will see it often.  Tell your family, friends, and co-workers that you are quitting. Their support is important.  Talk with your doctor about the choices that may help you quit.  Find out if your health insurance will pay for these treatments.  Know the people, places, things, and activities that make you want to smoke (triggers). Avoid them. What first steps can I take to quit smoking?  Throw away all cigarettes at home, at work, and in your car.  Throw away the things that you use when you smoke, such as ashtrays and lighters.  Clean your car. Make sure to empty the ashtray.  Clean your home, including curtains and carpets. What can I do to help me quit smoking? Talk with your doctor about taking medicines and seeing a counselor at the same time. You are more likely to succeed when you do both.  If you are pregnant or breastfeeding, talk with your doctor about counseling or other ways to quit smoking. Do not take medicine to help you quit smoking unless your doctor tells you to do so. To quit smoking: Quit right away  Quit smoking totally, instead of slowly cutting back on how much you smoke over a period of time.  Go to counseling. You are more likely to quit if you go to  counseling sessions regularly. Take medicine You may take medicines to help you quit. Some medicines need a prescription, and some you can buy over-the-counter. Some medicines may contain a drug called nicotine to replace the nicotine in cigarettes. Medicines may:  Help you to stop having the desire to smoke (cravings).  Help to stop the problems that come when you stop smoking (withdrawal symptoms). Your doctor may ask you to use:  Nicotine patches, gum, or lozenges.  Nicotine inhalers or sprays.  Non-nicotine medicine that is taken by mouth. Find resources Find resources and other ways to help you quit smoking and remain smoke-free after you quit. These resources are most helpful when you use them often. They include:  Online chats with a Social worker.  Phone quitlines.  Printed Furniture conservator/restorer.  Support groups or group counseling.  Text messaging programs.  Mobile phone apps. Use apps on your mobile phone or tablet that can help you stick to your quit plan. There are many free apps for mobile phones and tablets as well as websites. Examples include Quit Guide from the [employer] and smokefree.gov  What things can I do to make it easier to quit?   Talk to your family and friends. Ask them to support and encourage you.  Call a phone quitline (1-800-QUIT-NOW), reach out to support groups, or work with a Social worker.  Ask people who  smoke to not smoke around you.  Avoid places that make you want to smoke, such as: ? Bars. ? Parties. ? Smoke-break areas at work.  Spend time with people who do not smoke.  Lower the stress in your life. Stress can make you want to smoke. Try these things to help your stress: ? Getting regular exercise. ? Doing deep-breathing exercises. ? Doing yoga. ? Meditating. ? Doing a body scan. To do this, close your eyes, focus on one area of your body at a time from head to toe. Notice which parts of your body are tense. Try to relax the muscles in those  areas. How will I feel when I quit smoking? Day 1 to 3 weeks Within the first 24 hours, you may start to have some problems that come from quitting tobacco. These problems are very bad 2-3 days after you quit, but they do not often last for more than 2-3 weeks. You may get these symptoms:  Mood swings.  Feeling restless, nervous, angry, or annoyed.  Trouble concentrating.  Dizziness.  Strong desire for high-sugar foods and nicotine.  Weight gain.  Trouble pooping (constipation).  Feeling like you may vomit (nausea).  Coughing or a sore throat.  Changes in how the medicines that you take for other issues work in your body.  Depression.  Trouble sleeping (insomnia). Week 3 and afterward After the first 2-3 weeks of quitting, you may start to notice more positive results, such as:  Better sense of smell and taste.  Less coughing and sore throat.  Slower heart rate.  Lower blood pressure.  Clearer skin.  Better breathing.  Fewer sick days. Quitting smoking can be hard. Do not give up if you fail the first time. Some people need to try a few times before they succeed. Do your best to stick to your quit plan, and talk with your doctor if you have any questions or concerns. Summary  Smoking tobacco is the leading cause of preventable death. Quitting smoking can be hard, but it is one of the best things that you can do for your health.  When you decide to quit smoking, make a plan to help you succeed.  Quit smoking right away, not slowly over a period of time.  When you start quitting, seek help from your doctor, family, or friends. This information is not intended to replace advice given to you by your health care provider. Make sure you discuss any questions you have with your health care provider. Document Revised: 11/16/2018 Document Reviewed: 05/12/2018 Elsevier Patient Education  [place]. Cetirizine tablets What is this medicine? CETIRIZINE (se TI  ra zeen) is an antihistamine. This medicine is used to treat or prevent symptoms of allergies. It is also used to help reduce itchy skin rash and hives. This medicine may be used for other purposes; ask your health care provider or pharmacist if you have questions. COMMON BRAND NAME(S): All Day Allergy, Allergy Relief, Zyrtec, Zyrtec Hives Relief What should I tell my health care provider before I take this medicine? They need to know if you have any of these conditions:  kidney disease  liver disease  an unusual or allergic reaction to cetirizine, hydroxyzine, other medicines, foods, dyes, or preservatives  pregnant or trying to get pregnant  breast-feeding How should I use this medicine? Take this medicine by mouth with a glass of water. Follow the directions on the prescription label. You can take this medicine with food or on an empty  stomach. Take your medicine at regular times. Do not take more often than directed. You may need to take this medicine for several days before your symptoms improve. Talk to your pediatrician regarding the use of this medicine in children. Special care may be needed. While this drug may be prescribed for children as young as 86 years of age for selected conditions, precautions do apply. Overdosage: If you think you have taken too much of this medicine contact a poison control center or emergency room at once. NOTE: This medicine is only for you. Do not share this medicine with others. What if I miss a dose? If you miss a dose, take it as soon as you can. If it is almost time for your next dose, take only that dose. Do not take double or extra doses. What may interact with this medicine?  alcohol  certain medicines for anxiety or sleep  narcotic medicines for pain  other medicines for colds or allergies This list may not describe all possible interactions. Give your health care provider a list of all the medicines, herbs, non-prescription drugs, or  dietary supplements you use. Also tell them if you smoke, drink alcohol, or use illegal drugs. Some items may interact with your medicine. What should I watch for while using this medicine? Visit your doctor or health care professional for regular checks on your health. Tell your doctor if your symptoms do not improve. You may get drowsy or dizzy. Do not drive, use machinery, or do anything that needs mental alertness until you know how this medicine affects you. Do not stand or sit up quickly, especially if you are an older patient. This reduces the risk of dizzy or fainting spells. Your mouth may get dry. Chewing sugarless gum or sucking hard candy, and drinking plenty of water may help. Contact your doctor if the problem does not go away or is severe. What side effects may I notice from receiving this medicine? Side effects that you should report to your doctor or health care professional as soon as possible:  allergic reactions like skin rash, itching or hives, swelling of the face, lips, or tongue  changes in vision or hearing  fast or irregular heartbeat  trouble passing urine or change in the amount of urine Side effects that usually do not require medical attention (report to your doctor or health care professional if they continue or are bothersome):  dizziness  dry mouth  irritability  sore throat  stomach pain  tiredness This list may not describe all possible side effects. Call your doctor for medical advice about side effects. You may report side effects to FDA at 1-800-FDA-1088. Where should I keep my medicine? Keep out of the reach of children. Store at room temperature between 15 and 30 degrees C (59 and 86 degrees F). Throw away any unused medicine after the expiration date. NOTE: This sheet is a summary. It may not cover all possible information. If you have questions about this medicine, talk to your doctor, pharmacist, or health care provider.  2020 Elsevier/Gold  Standard (2014-03-18 13:44:42) Allergic Rhinitis, Adult Allergic rhinitis is a reaction to allergens in the air. Allergens are tiny specks (particles) in the air that cause your body to have an allergic reaction. This condition cannot be passed from person to person (is not contagious). Allergic rhinitis cannot be cured, but it can be controlled. There are two types of allergic rhinitis:  Seasonal. This type is also called hay fever. It happens only  during certain times of the year.  Perennial. This type can happen at any time of the year. What are the causes? This condition may be caused by:  Pollen from grasses, trees, and weeds.  House dust mites.  Pet dander.  Mold. What are the signs or symptoms? Symptoms of this condition include:  Sneezing.  Runny or stuffy nose (nasal congestion).  A lot of mucus in the back of the throat (postnasal drip).  Itchy nose.  Tearing of the eyes.  Trouble sleeping.  Being sleepy during day. How is this treated? There is no cure for this condition. You should avoid things that trigger your symptoms (allergens). Treatment can help to relieve symptoms. This may include:  Medicines that block allergy symptoms, such as antihistamines. These may be given as a shot, nasal spray, or pill.  Shots that are given until your body becomes less sensitive to the allergen (desensitization).  Stronger medicines, if all other treatments have not worked. Follow these instructions at home: Avoiding allergens   Find out what you are allergic to. Common allergens include smoke, dust, and pollen.  Avoid them if you can. These are some of the things that you can do to avoid allergens: ? Replace carpet with wood, tile, or vinyl flooring. Carpet can trap dander and dust. ? Clean any mold found in the home. ? Do not smoke. Do not allow smoking in your home. ? Change your heating and air conditioning filter at least once a month. ? During allergy  season:  Keep windows closed as much as you can. If possible, use air conditioning when there is a lot of pollen in the air.  Use a special filter for allergies with your furnace and air conditioner.  Plan outdoor activities when pollen counts are lowest. This is usually during the early morning or evening hours.  If you do go outdoors when pollen count is high, wear a special mask for people with allergies.  When you come indoors, take a shower and change your clothes before sitting on furniture or bedding. General instructions  Do not use fans in your home.  Do not hang clothes outside to dry.  Wear sunglasses to keep pollen out of your eyes.  Wash your hands right away after you touch household pets.  Take over-the-counter and prescription medicines only as told by your doctor.  Keep all follow-up visits as told by your doctor. This is important. Contact a doctor if:  You have a fever.  You have a cough that does not go away (is persistent).  You start to make whistling sounds when you breathe (wheeze).  Your symptoms do not get better with treatment.  You have thick fluid coming from your nose.  You start to have nosebleeds. Get help right away if:  Your tongue or your lips are swollen.  You have trouble breathing.  You feel dizzy or you feel like you are going to pass out (faint).  You have cold sweats. Summary  Allergic rhinitis is a reaction to allergens in the air.  This condition may be caused by allergens. These include pollen, dust mites, pet dander, and mold.  Symptoms include a runny, itchy nose, sneezing, or tearing eyes. You may also have trouble sleeping or feel sleepy during the day.  Treatment includes taking medicines and avoiding allergens. You may also get shots or take stronger medicines.  Get help if you have a fever or a cough that does not stop. Get help right away  if you are short of breath. This information is not intended to replace  advice given to you by your health care provider. Make sure you discuss any questions you have with your health care provider. Document Revised: 06/12/2018 Document Reviewed: 09/12/2017 Elsevier Patient Education  [place].

## 2020-01-16 NOTE — Progress Notes (Signed)
Acute Office Visit  Subjective:    Patient ID: Yvonne Crawford, female    DOB: 01/17/1991, 29 y.o.   MRN: [id number]  Chief Complaint  Patient presents with  . wheezing    Just started this morning and happened one time    HPI Patient is in today for wheezing. Onset was this morning. States it has now subsided. Denies any recent colds, coughs, or illnesses. States her spouse and daughter had strep throat last week. She denies fever, dyspnea, sore throat, cough, history of asthma.Treatments include deep breathing and coughing that cleared wheezing. She states she does have seasonal allergies, pets in her home, and she is a 10-year ppd cigarette smoker.   Past Medical History:  Diagnosis Date  . Anxiety   . Dysrhythmia   . History of pneumonia   . Pneumonia    history  . Sinus arrhythmia     Past Surgical History:  Procedure Laterality Date  . CHOLECYSTECTOMY N/A 01/14/2019   Procedure: LAPAROSCOPIC CHOLECYSTECTOMY;  Surgeon: Erroll Luna, MD;  Location: [place];  Service: General;  Laterality: N/A;  . INTRAOPERATIVE CHOLANGIOGRAM N/A 01/14/2019   Procedure: Intraoperative Cholangiogram;  Surgeon: Erroll Luna, MD;  Location: [place];  Service: General;  Laterality: N/A;  . TONSILLECTOMY  2000    History reviewed. No pertinent family history.  Social History   Socioeconomic History  . Marital status: Married    Spouse name: Not on file  . Number of children: 1  . Years of education: Not on file  . Highest education level: Not on file  Occupational History  . Not on file  Tobacco Use  . Smoking status: Current Every Day Smoker    Packs/day: 0.50    Years: 10.00    Pack years: 5.00    Types: Cigarettes  . Smokeless tobacco: Never Used  Vaping Use  . Vaping Use: Never used  Substance and Sexual Activity  . Alcohol use: Yes    Comment: occasionally  . Drug use: Never  . Sexual activity: Yes    Comment: husband vasectomy  Other Topics Concern  . Not on file    Social History Narrative  . Not on file   Social Determinants of Health   Financial Resource Strain:   . Difficulty of Paying Living Expenses: Not on file  Food Insecurity:   . Worried About Charity fundraiser in the Last Year: Not on file  . Ran Out of Food in the Last Year: Not on file  Transportation Needs:   . Lack of Transportation (Medical): Not on file  . Lack of Transportation (Non-Medical): Not on file  Physical Activity:   . Days of Exercise per Week: Not on file  . Minutes of Exercise per Session: Not on file  Stress:   . Feeling of Stress : Not on file  Social Connections:   . Frequency of Communication with Friends and Family: Not on file  . Frequency of Social Gatherings with Friends and Family: Not on file  . Attends Religious Services: Not on file  . Active Member of Clubs or Organizations: Not on file  . Attends Archivist Meetings: Not on file  . Marital Status: Not on file  Intimate Partner Violence:   . Fear of Current or Ex-Partner: Not on file  . Emotionally Abused: Not on file  . Physically Abused: Not on file  . Sexually Abused: Not on file    Outpatient Medications Prior to Visit  Medication Sig  Dispense Refill  . acetaminophen (TYLENOL) 325 MG tablet Take 2 tablets (650 mg total) by mouth every 6 (six) hours as needed for mild pain (or temp > 100).    Marland Kitchen ibuprofen (ADVIL) 400 MG tablet Take 1 tablet (400 mg total) by mouth every 6 (six) hours as needed for moderate pain. 30 tablet 0  . oxyCODONE (OXY IR/ROXICODONE) 5 MG immediate release tablet Take 1 tablet (5 mg total) by mouth every 6 (six) hours as needed for severe pain. 20 tablet 0   No facility-administered medications prior to visit.    Allergies  Allergen Reactions  . Percocet [Oxycodone-Acetaminophen]     Upset stomach  . Rocephin [Ceftriaxone] Hives    Review of Systems  Constitutional: Negative for fatigue and fever.  HENT: Negative for congestion, ear pain, sinus  pressure and sore throat.   Eyes: Negative for pain.  Respiratory: Positive for wheezing (subsided prior to arriving for appointment). Negative for cough, chest tightness and shortness of breath.   Cardiovascular: Negative for chest pain and palpitations.  Gastrointestinal: Negative for abdominal pain, constipation, diarrhea, nausea and vomiting.  Genitourinary: Negative for dysuria and hematuria.  Musculoskeletal: Negative for arthralgias, back pain, joint swelling and myalgias.  Skin: Negative for rash.  Allergic/Immunologic: Positive for environmental allergies.  Neurological: Negative for dizziness, weakness and headaches.  Psychiatric/Behavioral: Negative for dysphoric mood. The patient is not nervous/anxious.        Objective:    BP 128/72 (BP Location: Left Arm, Patient Position: Sitting)   Pulse 66   Temp 97.7 F (36.5 C) (Temporal)   Ht 5\' 7"  (1.702 m)   Wt 194 lb (88 kg)   SpO2 98%   BMI 30.38 kg/m  Physical Exam Vitals reviewed.  Constitutional:      Appearance: Normal appearance.  HENT:     Right Ear: Tympanic membrane, ear canal and external ear normal.     Left Ear: Tympanic membrane, ear canal and external ear normal.     Nose: Nose normal.     Mouth/Throat:     Mouth: Mucous membranes are moist.  Eyes:     Pupils: Pupils are equal, round, and reactive to light.  Cardiovascular:     Rate and Rhythm: Normal rate and regular rhythm.     Pulses: Normal pulses.     Heart sounds: Normal heart sounds.  Pulmonary:     Effort: Pulmonary effort is normal.     Breath sounds: Normal breath sounds.  Abdominal:     General: Bowel sounds are normal.     Palpations: Abdomen is soft.  Musculoskeletal:        General: Normal range of motion.     Cervical back: Neck supple.  Skin:    General: Skin is warm and dry.     Capillary Refill: Capillary refill takes less than 2 seconds.  Neurological:     General: No focal deficit present.     Mental Status: She is alert  and oriented to person, place, and time.  Psychiatric:        Mood and Affect: Mood normal.        Behavior: Behavior normal.        Thought Content: Thought content normal.        Judgment: Judgment normal.     BP 128/72 (BP Location: Left Arm, Patient Position: Sitting)   Pulse 66   Temp 97.7 F (36.5 C) (Temporal)   Ht 5\' 7"  (1.702 m)   Wt 194  lb (88 kg)   SpO2 98%   BMI 30.38 kg/m  Wt Readings from Last 3 Encounters:  01/16/20 194 lb (88 kg)  01/13/19 206 lb 9.1 oz (93.7 kg)    Health Maintenance Due  Topic Date Due  . Hepatitis C Screening  Never done  . COVID-19 Vaccine (1) Never done  . TETANUS/TDAP  Never done  . PAP SMEAR-Modifier  Never done  . INFLUENZA VACCINE  Never done    There are no preventive care reminders to display for this patient.   No results found for: TSH Lab Results  Component Value Date   WBC 10.6 (H) 01/14/2019   HGB 12.7 01/14/2019   HCT 38.9 01/14/2019   MCV 94.0 01/14/2019   PLT 287 01/14/2019   Lab Results  Component Value Date   NA 138 01/14/2019   K 4.2 01/14/2019   CO2 23 01/14/2019   GLUCOSE 109 (H) 01/14/2019   BUN <5 (L) 01/14/2019   CREATININE 0.69 01/14/2019   BILITOT 0.4 01/13/2019   ALKPHOS 79 01/13/2019   AST 18 01/13/2019   ALT 21 01/13/2019   PROT 6.7 01/13/2019   ALBUMIN 4.0 01/13/2019   CALCIUM 8.8 (L) 01/14/2019   ANIONGAP 8 01/14/2019   No results found for: CHOL No results found for: HDL No results found for: LDLCALC No results found for: TRIG No results found for: CHOLHDL No results found for: HGBA1C       Assessment & Plan:    1. Seasonal allergies -Zyrtec OTC 1 tab daily   2. Tobacco dependence due to cigarettes -Smoking cessation  Take Zyrtec daily for seasonal allergies Call if symptoms worsen or fail to improve  Follow-up: PRN  Jerrell Belfast, [place], [place]

## 2021-01-08 IMAGING — US US ABDOMEN LIMITED
1 series · 14 of 25 positions shown · non-contrast
Comparison: None.

CLINICAL DATA: Right upper quadrant pain since yesterday.

EXAM:
ULTRASOUND ABDOMEN LIMITED RIGHT UPPER QUADRANT

[Series 1: us abdomen limited · 14 of 65 slices shown]
[im 1/65]
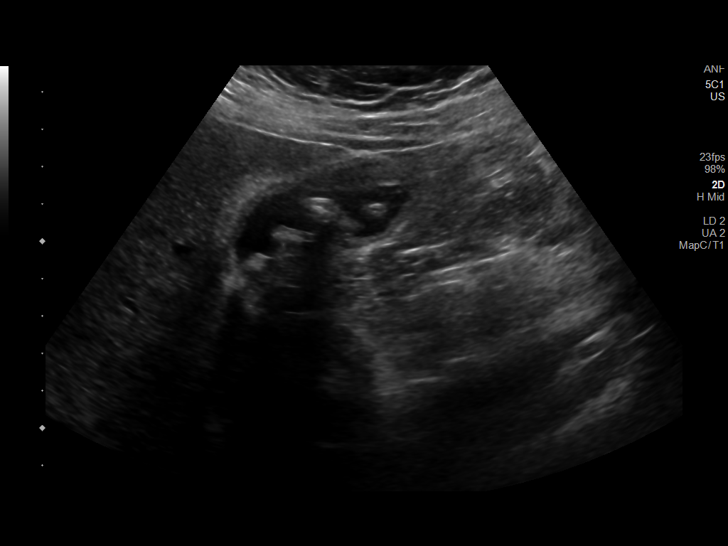
[im 6/65]
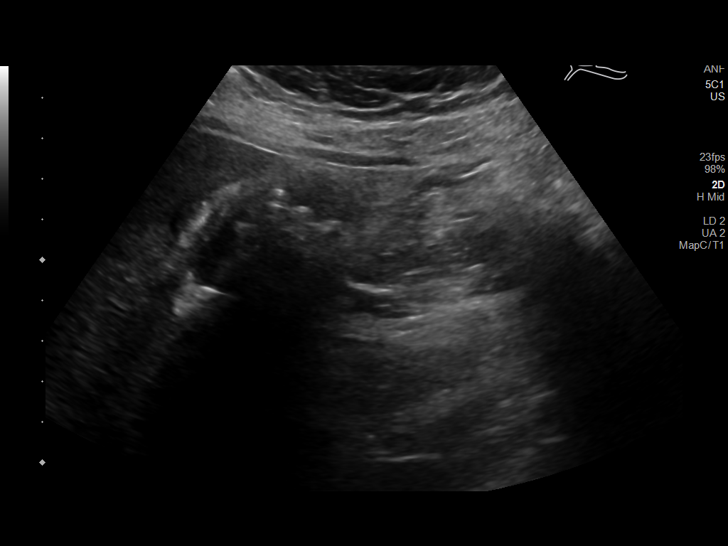
[im 11/65]
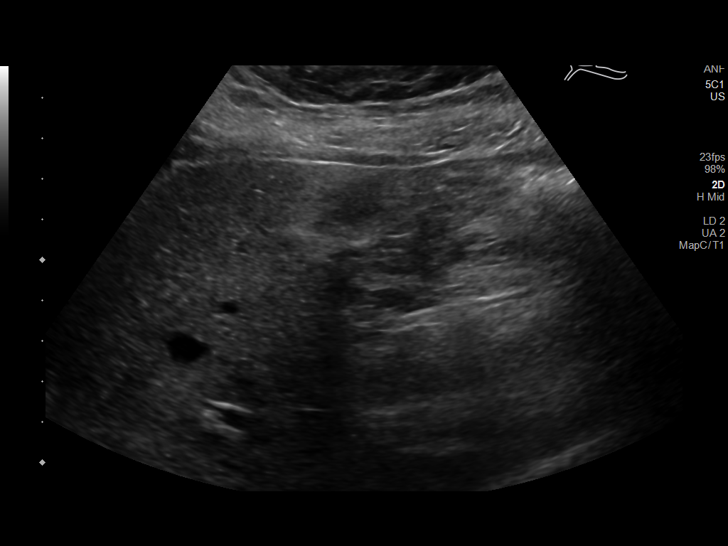
[im 17/65]
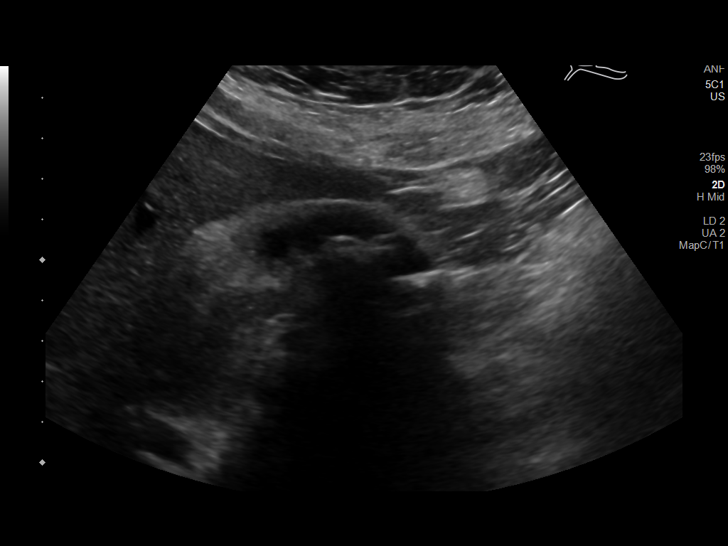
[im 22/65]
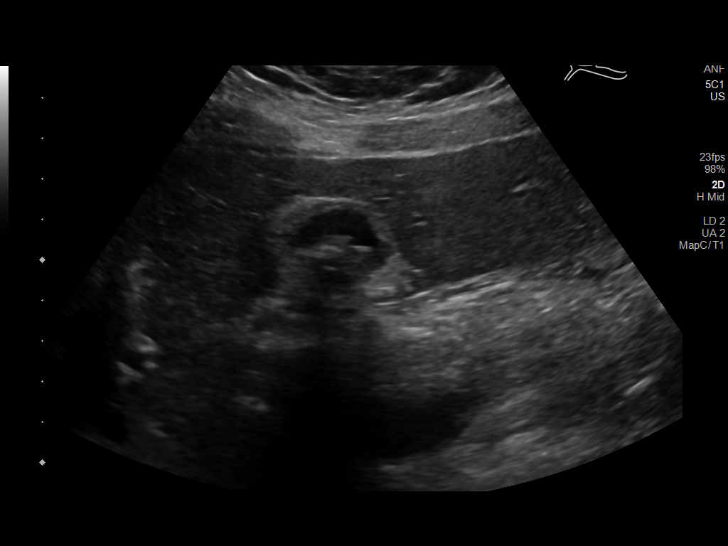
[im 25/65]
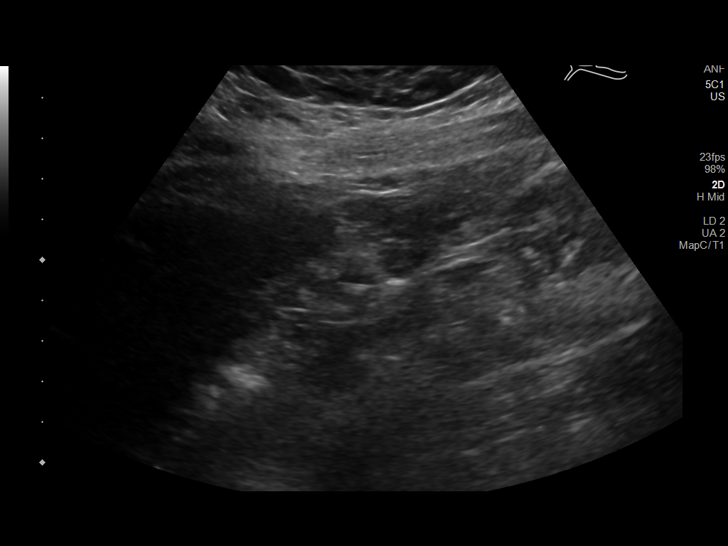
[im 30/65]
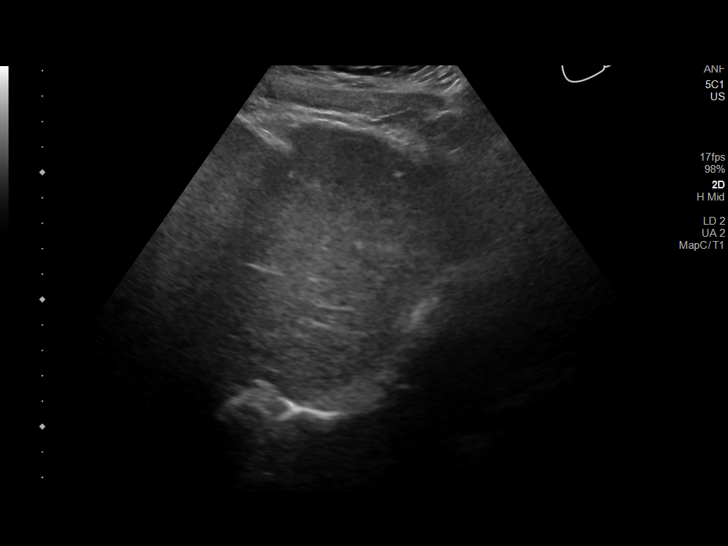
[im 35/65]
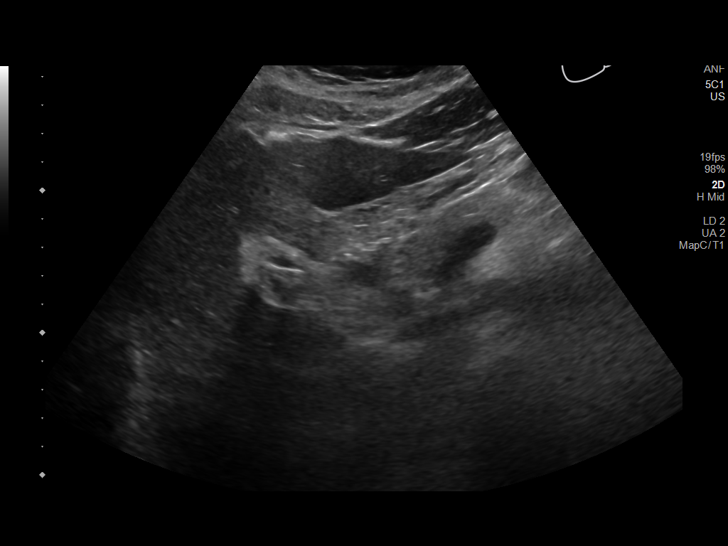
[im 41/65]
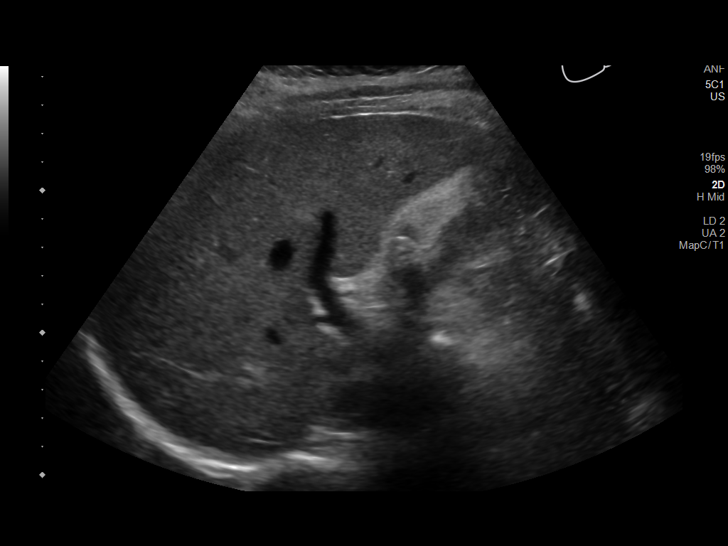
[im 43/65]
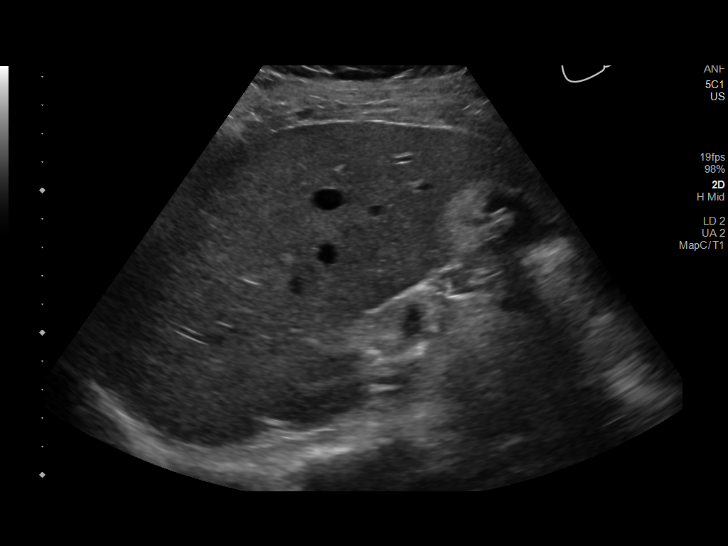
[im 49/65]
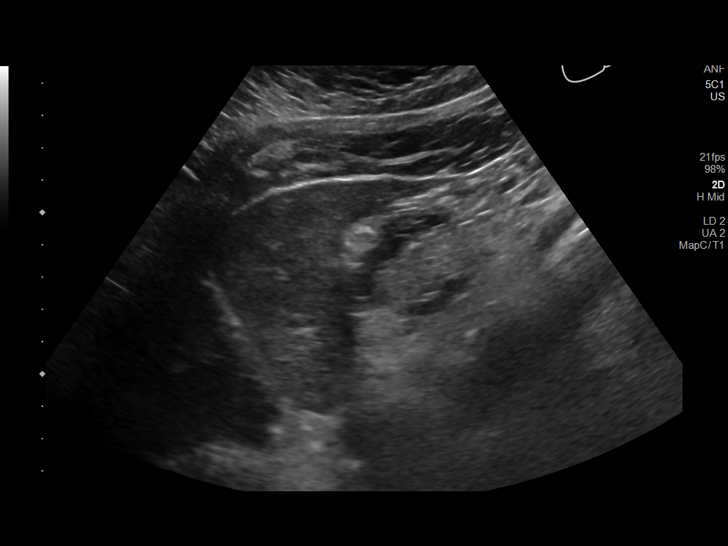
[im 54/65]
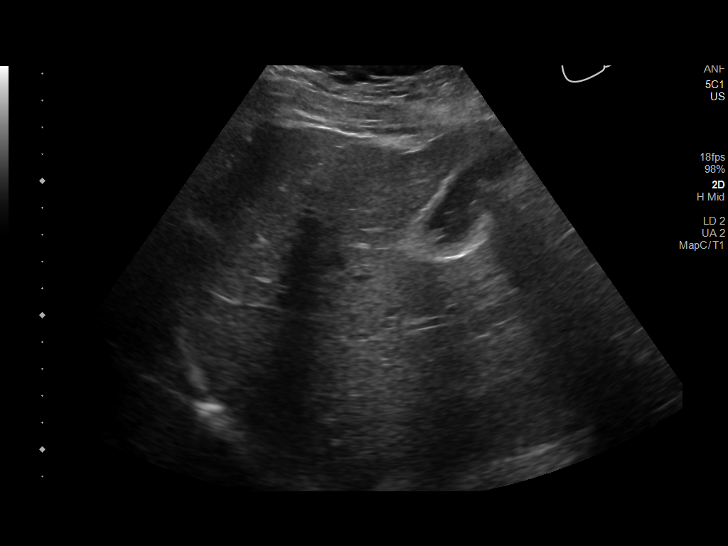
[im 59/65]
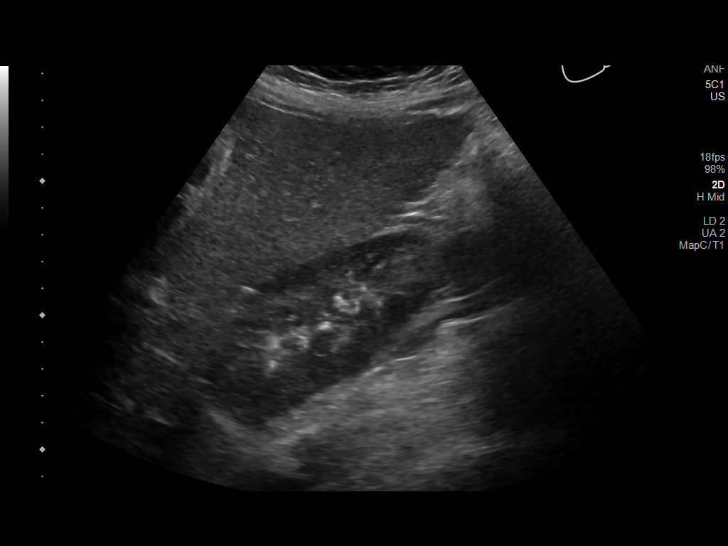
[im 65/65]
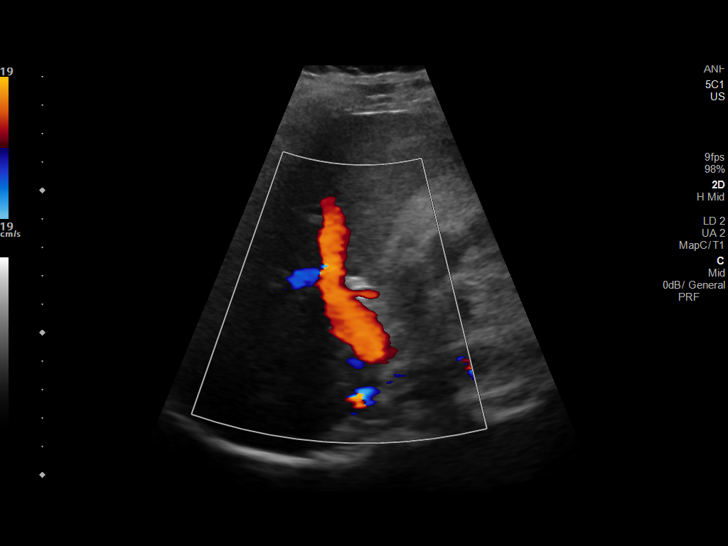

[14 of 25 positions shown; findings below may reference images not displayed]

FINDINGS: Gallbladder:

Moderate cholelithiasis with largest stone measuring 1.6 cm. There
is gallbladder wall thickening measuring 5.6 mm. Negative
sonographic Murphy sign. No adjacent free fluid.

Common bile duct:

Diameter: 3.9 mm.

Liver:

No focal lesion identified. Within normal limits in parenchymal
echogenicity. Portal vein is patent on color Doppler imaging with
normal direction of blood flow towards the liver.

Other: None.
IMPRESSION: Moderate cholelithiasis with gallbladder wall thickening as these
findings may be seen with acute cholecystitis as recommend clinical
correlation.

## 2021-01-09 IMAGING — RF DG CHOLANGIOGRAM OPERATIVE
1 series · 4 of 4 positions shown · non-contrast
Comparison: None.

CLINICAL DATA: 28-year-old female with laparoscopic cholecystectomy
for cholelithiasis

EXAM:
INTRAOPERATIVE CHOLANGIOGRAM
TECHNIQUE: Cholangiographic images from the C-arm fluoroscopic device were
submitted for interpretation post-operatively. Please see the
procedural report for the amount of contrast and the fluoroscopy
time utilized.

[Series 1: run · 4 of 20 frames shown]
[frame 4/20]
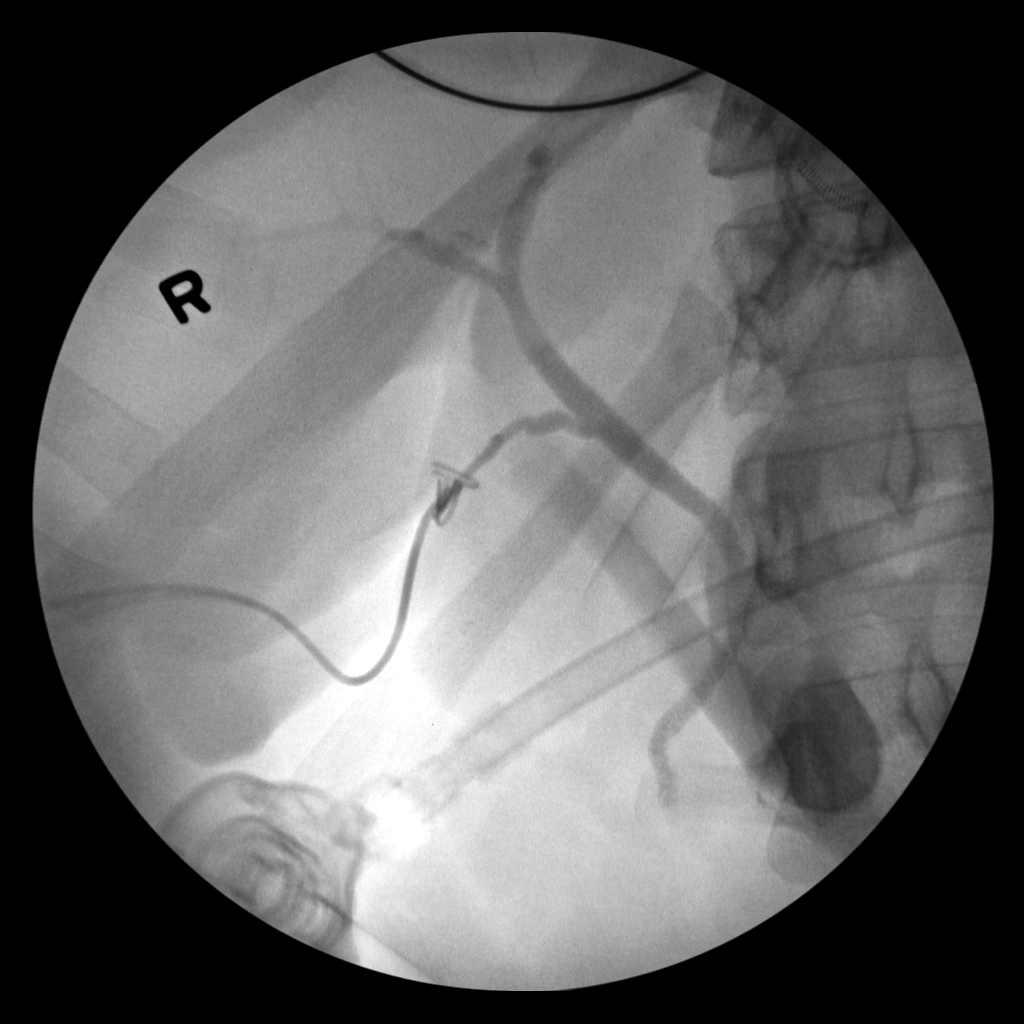
[frame 11/20]
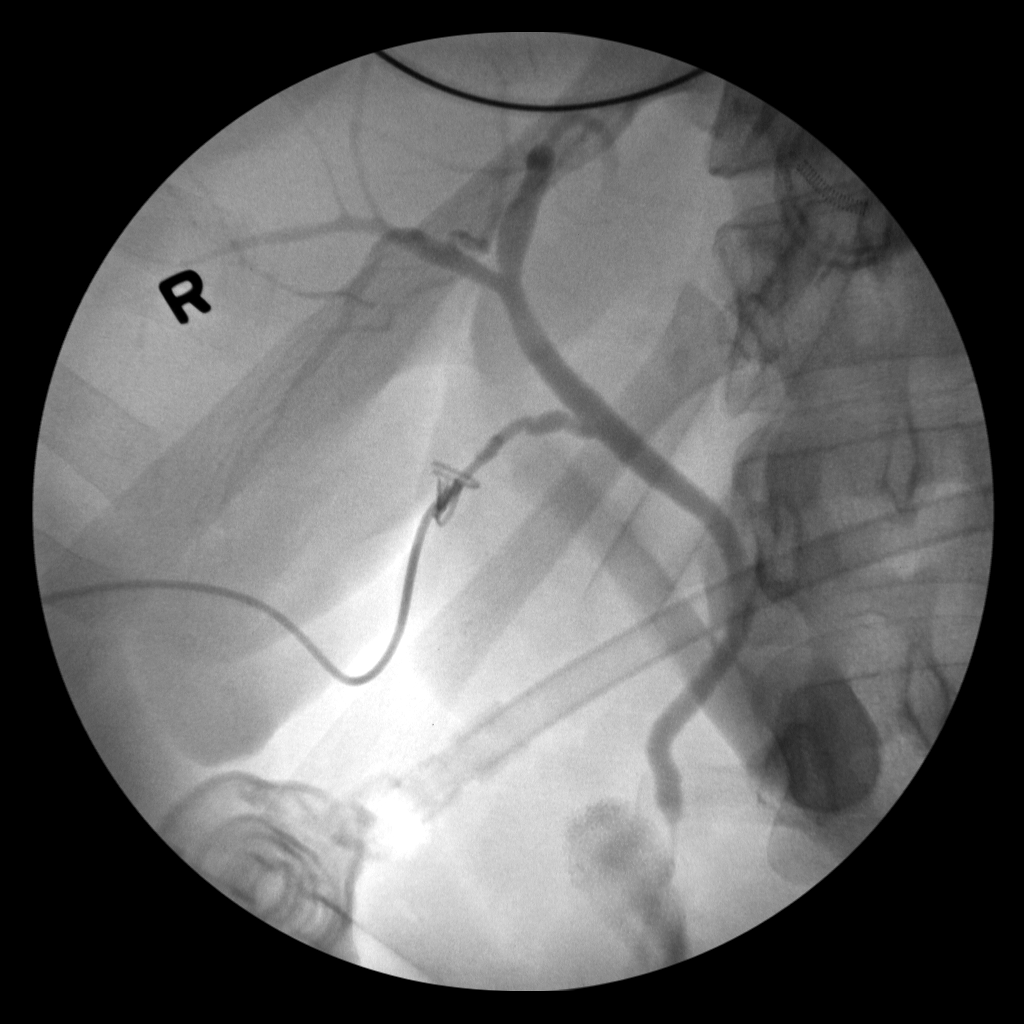
[frame 18/20]
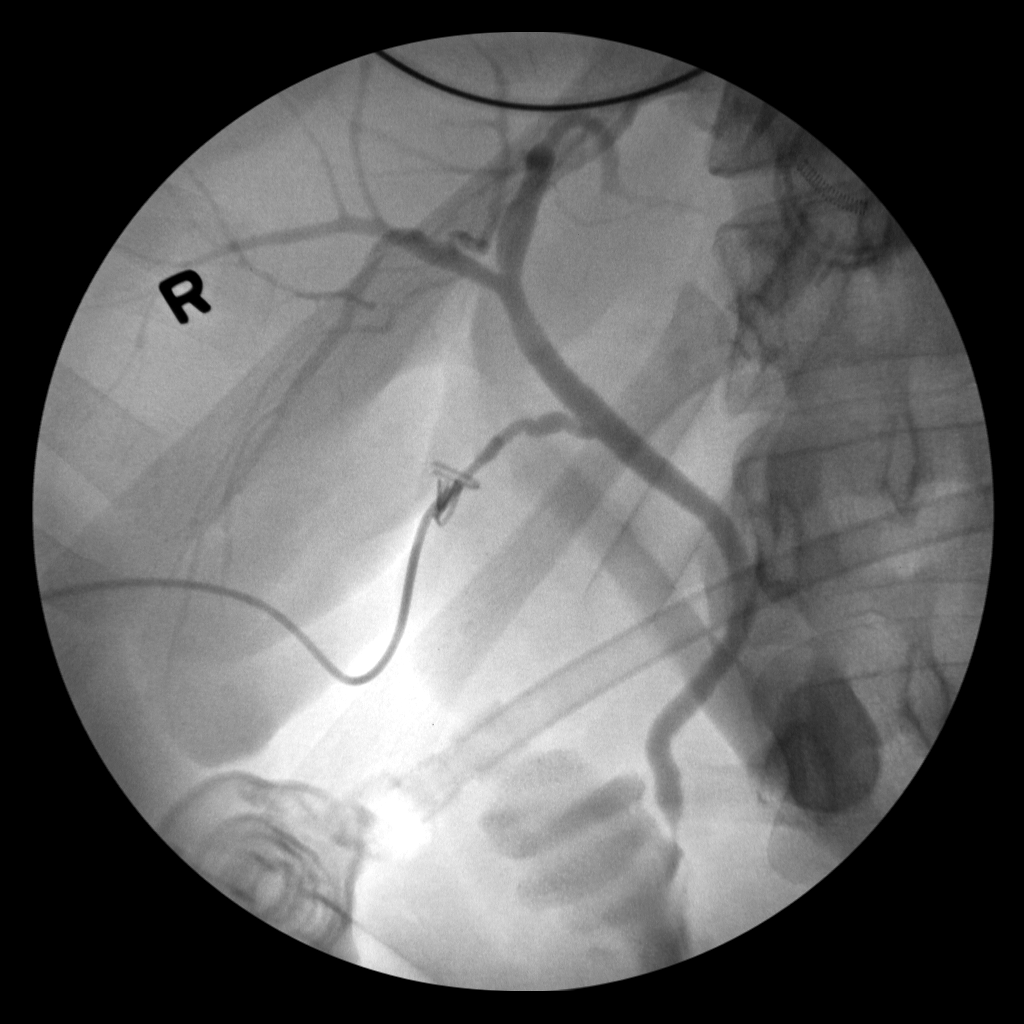
[frame 20/20]
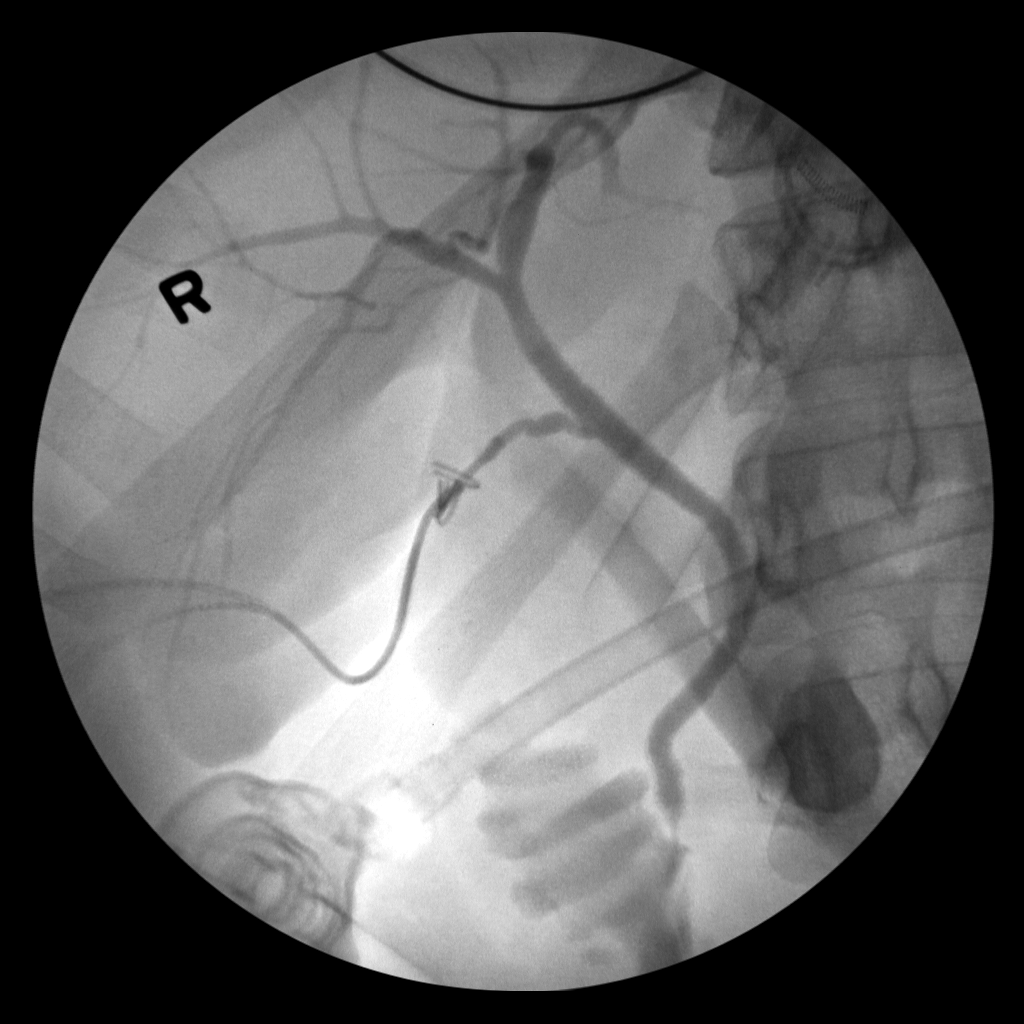

[4 of 4 positions shown; findings below may reference images not displayed]

FINDINGS: Surgical instruments project over the upper abdomen.

There is cannulation of the cystic duct/gallbladder neck, with
antegrade infusion of contrast. Caliber of the extrahepatic ductal
system within normal limits.

No definite filling defect within the extrahepatic ducts identified.

Free flow of contrast across the ampulla.
IMPRESSION: Intraoperative cholangiogram demonstrates extrahepatic biliary ducts
of unremarkable caliber, with no definite filling defects
identified. Free flow of contrast across the ampulla.

Please refer to the dictated operative report for full details of
intraoperative findings and procedure

## 2022-04-18 ENCOUNTER — Encounter: Payer: 59 | Admitting: Physician Assistant

## 2022-05-03 ENCOUNTER — Ambulatory Visit (INDEPENDENT_AMBULATORY_CARE_PROVIDER_SITE_OTHER): Payer: 59 | Admitting: Physician Assistant

## 2022-05-03 ENCOUNTER — Encounter: Payer: Self-pay | Admitting: Physician Assistant

## 2022-05-03 VITALS — BP 118/70 | HR 95 | Temp 97.6°F | Ht 67.0 in | Wt 220.0 lb

## 2022-05-03 DIAGNOSIS — Z0000 Encounter for general adult medical examination without abnormal findings: Secondary | ICD-10-CM | POA: Diagnosis not present

## 2022-05-03 LAB — POCT URINALYSIS DIP (CLINITEK)
Bilirubin, UA: NEGATIVE
Blood, UA: NEGATIVE
Glucose, UA: NEGATIVE mg/dL
Ketones, POC UA: NEGATIVE mg/dL
Leukocytes, UA: NEGATIVE
Nitrite, UA: NEGATIVE
POC PROTEIN,UA: NEGATIVE
Spec Grav, UA: 1.01 (ref 1.010–1.025)
Urobilinogen, UA: 0.2 E.U./dL
pH, UA: 5 (ref 5.0–8.0)

## 2022-05-03 NOTE — Progress Notes (Signed)
Subjective:  Patient ID: Yvonne Crawford, female    DOB: 1990/12/15  Age: 32 y.o. MRN: [id number]  Chief Complaint  Patient presents with   Annual Exam    HPI Well Adult Physical: Patient here for a comprehensive physical exam.The patient reports no problems Do you take any herbs or supplements that were not prescribed by a doctor? no Are you taking calcium supplements? no Are you taking aspirin daily? no  Encounter for general adult medical examination without abnormal findings  Physical ("At Risk" items are starred): Patient's last pap with GYN was 1-2 years ago Patient is not afflicted from Stress Incontinence and Urge Incontinence  Patient wears a seat belts Patient has smoke detectors and has carbon monoxide detectors. Patient practices appropriate gun safety. Patient wears sunscreen with extended sun exposure. Dental Care: has dentures Ophthalmology/Optometry: is due Hearing loss: none Vision impairments: none   LMP: last week Pregnancy history: G1P1 Safe at home: yes Self breast exams: yes     05/03/2022    3:28 PM  Depression screen PHQ 2/9  Decreased Interest 0  Down, Depressed, Hopeless 0  PHQ - 2 Score 0         01/13/2019    2:00 PM 01/13/2019   10:00 PM 01/14/2019   11:44 AM 05/03/2022    3:28 PM  Fall Risk  Falls in the past year?    0  Was there an injury with Fall?    0  Fall Risk Category Calculator    0  (RETIRED) Patient Fall Risk Level Low fall risk Low fall risk Moderate fall risk   Patient at Risk for Falls Due to    No Fall Risks  Fall risk Follow up    Falls evaluation completed             Social Hx   Social History   Socioeconomic History   Marital status: Married    Spouse name: Not on file   Number of children: 1   Years of education: Not on file   Highest education level: Not on file  Occupational History   Not on file  Tobacco Use   Smoking status: Every Day    Packs/day: 0.50    Years: 10.00    Total pack years: 5.00     Types: Cigarettes   Smokeless tobacco: Never  Vaping Use   Vaping Use: Never used  Substance and Sexual Activity   Alcohol use: Not Currently    Comment: occasionally   Drug use: Never   Sexual activity: Yes    Birth control/protection: Other-see comments    Comment: husband vasectomy  Other Topics Concern   Not on file  Social History Narrative   Not on file   Social Determinants of Health   Financial Resource Strain: Low Risk  (05/03/2022)   Overall Financial Resource Strain (CARDIA)    Difficulty of Paying Living Expenses: Not very hard  Food Insecurity: No Food Insecurity (05/03/2022)   Hunger Vital Sign    Worried About Running Out of Food in the Last Year: Never true    Ran Out of Food in the Last Year: Never true  Transportation Needs: No Transportation Needs (05/03/2022)   PRAPARE - Hydrologist (Medical): No    Lack of Transportation (Non-Medical): No  Physical Activity: Inactive (05/03/2022)   Exercise Vital Sign    Days of Exercise per Week: 0 days    Minutes of Exercise per Session: 0 min  Stress: No Stress Concern Present (05/03/2022)   [place]    Feeling of Stress : Only a little  Social Connections: Moderately Isolated (05/03/2022)   Social Connection and Isolation Panel [NHANES]    Frequency of Communication with Friends and Family: More than three times a week    Frequency of Social Gatherings with Friends and Family: Never    Attends Religious Services: Never    Marine scientist or Organizations: No    Attends Archivist Meetings: Never    Marital Status: Married   Past Medical History:  Diagnosis Date   Allergy    Anxiety    Depression    Dysrhythmia    GERD (gastroesophageal reflux disease)    History of pneumonia    Pneumonia    history   Sinus arrhythmia    Past Surgical History:  Procedure Laterality Date   CHOLECYSTECTOMY N/A  01/14/2019   Procedure: LAPAROSCOPIC CHOLECYSTECTOMY;  Surgeon: Erroll Luna, MD;  Location: [facility];  Service: General;  Laterality: N/A;   INTRAOPERATIVE CHOLANGIOGRAM N/A 01/14/2019   Procedure: Intraoperative Cholangiogram;  Surgeon: Erroll Luna, MD;  Location: [facility];  Service: General;  Laterality: N/A;   TONSILLECTOMY  2000    Family History  Problem Relation Age of Onset   Anxiety disorder Mother    Depression Mother    Cancer Father     Review of Systems CONSTITUTIONAL: Negative for chills, fatigue, fever, unintentional weight gain and unintentional weight loss.  E/N/T: Negative for ear pain, nasal congestion and sore throat.  CARDIOVASCULAR: Negative for chest pain, dizziness, palpitations and pedal edema.  RESPIRATORY: Negative for recent cough and dyspnea.  GASTROINTESTINAL: Negative for abdominal pain, acid reflux symptoms, constipation, diarrhea, nausea and vomiting.  MSK: Negative for arthralgias and myalgias.  INTEGUMENTARY: Negative for rash.  NEUROLOGICAL: Negative for dizziness and headaches.  PSYCHIATRIC: Negative for sleep disturbance and to question depression screen.  Negative for depression, negative for anhedonia.       Objective:  PHYSICAL EXAM:   VS: BP 118/70 (BP Location: Left Arm, Patient Position: Sitting, Cuff Size: Large)   Pulse 95   Temp 97.6 F (36.4 C) (Temporal)   Ht '5\' 7"'$  (1.702 m)   Wt 220 lb (99.8 kg)   SpO2 99%   BMI 34.46 kg/m   GEN: Well nourished, well developed, in no acute distress  HEENT: normal external ears and nose - normal external auditory canals and TMS - - Lips, and Gums - normal  Oropharynx - normal mucosa, palate, and posterior pharynx Cardiac: RRR; no murmurs, rubs, or gallops, Respiratory:  normal respiratory rate and pattern with no distress - normal breath sounds with no rales, rhonchi, wheezes or rubs GI: normal bowel sounds, no masses or tenderness MS: no deformity or atrophy  Skin: warm and dry, no rash   Neuro:  Alert and Oriented x 3, - CN II-Xii grossly intact Psych: euthymic mood, appropriate affect and demeanor  Office Visit on 05/03/2022  Component Date Value Ref Range Status   Color, UA 05/03/2022 yellow  yellow Final   Clarity, UA 05/03/2022 clear  clear Final   Glucose, UA 05/03/2022 negative  negative mg/dL Final   Bilirubin, UA 05/03/2022 negative  negative Final   Ketones, POC UA 05/03/2022 negative  negative mg/dL Final   Spec Grav, UA 05/03/2022 1.010  1.010 - 1.025 Final   Blood, UA 05/03/2022 negative  negative Final   pH, UA 05/03/2022 5.0  5.0 - 8.0 Final   POC PROTEIN,UA 05/03/2022 negative  negative, trace Final   Urobilinogen, UA 05/03/2022 0.2  0.2 or 1.0 E.U./dL Final   Nitrite, UA 05/03/2022 Negative  Negative Final   Leukocytes, UA 05/03/2022 Negative  Negative Final     Lab Results  Component Value Date   WBC 10.6 (H) 01/14/2019   HGB 12.7 01/14/2019   HCT 38.9 01/14/2019   PLT 287 01/14/2019   GLUCOSE 109 (H) 01/14/2019   ALT 21 01/13/2019   AST 18 01/13/2019   NA 138 01/14/2019   K 4.2 01/14/2019   CL 107 01/14/2019   CREATININE 0.69 01/14/2019   BUN <5 (L) 01/14/2019   CO2 23 01/14/2019      Assessment & Plan:  Tineke was seen today for annual exam.  Annual physical exam -     POCT URINALYSIS DIP (CLINITEK) -     CBC with Differential/Platelet; Future -     Comprehensive metabolic panel; Future -     TSH; Future -     Lipid panel; Future      Body mass index is 34.46 kg/m.   These are the goals we discussed:  Goals   None      This is a list of the screening recommended for you and due dates:  Health Maintenance  Topic Date Due   Pap Smear  Never done   Flu Shot  06/05/2022*   HIV Screening  Completed   HPV Vaccine  Aged Out   DTaP/Tdap/Td vaccine  Discontinued   COVID-19 Vaccine  Discontinued   Hepatitis C Screening: USPSTF Recommendation to screen - Ages 77-79 yo.  Discontinued  *Topic was postponed. The date shown  is not the original due date.     No orders of the defined types were placed in this encounter.   Follow-up: Return in about 1 year (around 05/04/2023) for physical - this week nurse visit fasting lab.  An After Visit Summary was printed and given to the patient.  Yetta Flock [facility] [phone]

## 2022-05-04 ENCOUNTER — Other Ambulatory Visit: Payer: 59

## 2022-05-04 DIAGNOSIS — Z0000 Encounter for general adult medical examination without abnormal findings: Secondary | ICD-10-CM

## 2022-05-05 LAB — COMPREHENSIVE METABOLIC PANEL
ALT: 32 IU/L (ref 0–32)
AST: 19 IU/L (ref 0–40)
Albumin/Globulin Ratio: 2.1 (ref 1.2–2.2)
Albumin: 4.5 g/dL (ref 3.9–4.9)
Alkaline Phosphatase: 87 IU/L (ref 44–121)
BUN/Creatinine Ratio: 17 (ref 9–23)
BUN: 11 mg/dL (ref 6–20)
Bilirubin Total: <0.2 mg/dL (ref 0.0–1.2)
CO2: 21 mmol/L (ref 20–29)
Calcium: 10 mg/dL (ref 8.7–10.2)
Chloride: 103 mmol/L (ref 96–106)
Creatinine, Ser: 0.65 mg/dL (ref 0.57–1.00)
Globulin, Total: 2.1 g/dL (ref 1.5–4.5)
Glucose: 94 mg/dL (ref 70–99)
Potassium: 5.1 mmol/L (ref 3.5–5.2)
Sodium: 140 mmol/L (ref 134–144)
Total Protein: 6.6 g/dL (ref 6.0–8.5)
eGFR: 120 mL/min/{1.73_m2} (ref >59)

## 2022-05-05 LAB — CBC WITH DIFFERENTIAL/PLATELET
Basophils Absolute: 0.1 10*3/uL (ref 0.0–0.2)
Basos: 1 %
EOS (ABSOLUTE): 0.2 10*3/uL (ref 0.0–0.4)
Eos: 2 %
Hematocrit: 41.4 % (ref 34.0–46.6)
Hemoglobin: 13.9 g/dL (ref 11.1–15.9)
Immature Grans (Abs): 0.1 10*3/uL (ref 0.0–0.1)
Immature Granulocytes: 1 %
Lymphocytes Absolute: 4.1 10*3/uL — ABNORMAL HIGH (ref 0.7–3.1)
Lymphs: 31 %
MCH: 30.8 pg (ref 26.6–33.0)
MCHC: 33.6 g/dL (ref 31.5–35.7)
MCV: 92 fL (ref 79–97)
Monocytes Absolute: 0.8 10*3/uL (ref 0.1–0.9)
Monocytes: 6 %
Neutrophils Absolute: 7.7 10*3/uL — ABNORMAL HIGH (ref 1.4–7.0)
Neutrophils: 59 %
Platelets: 345 10*3/uL (ref 150–450)
RBC: 4.51 x10E6/uL (ref 3.77–5.28)
RDW: 12.8 % (ref 11.7–15.4)
WBC: 13 10*3/uL — ABNORMAL HIGH (ref 3.4–10.8)

## 2022-05-05 LAB — LIPID PANEL
Chol/HDL Ratio: 4.8 ratio — ABNORMAL HIGH (ref 0.0–4.4)
Cholesterol, Total: 211 mg/dL — ABNORMAL HIGH (ref 100–199)
HDL: 44 mg/dL (ref >39)
LDL Chol Calc (NIH): 147 mg/dL — ABNORMAL HIGH (ref 0–99)
Triglycerides: 113 mg/dL (ref 0–149)
VLDL Cholesterol Cal: 20 mg/dL (ref 5–40)

## 2022-05-05 LAB — CARDIOVASCULAR RISK ASSESSMENT

## 2022-05-05 LAB — TSH: TSH: 0.876 u[IU]/mL (ref 0.450–4.500)

## 2022-05-05 NOTE — Progress Notes (Signed)
Blood count abnormal. Wbc elevated. Is patient feeling sick? Liver function normal.  Kidney function normal.  Thyroid function normal.  Cholesterol: elevated.  Recommend low fat diet and exercise recommend wt loss.

## 2023-07-24 ENCOUNTER — Ambulatory Visit: Payer: Self-pay

## 2023-07-24 ENCOUNTER — Ambulatory Visit: Admitting: Physician Assistant

## 2023-07-24 VITALS — BP 102/66 | HR 74 | Temp 98.0°F | Resp 18 | Ht 67.0 in | Wt 211.8 lb

## 2023-07-24 DIAGNOSIS — K219 Gastro-esophageal reflux disease without esophagitis: Secondary | ICD-10-CM | POA: Insufficient documentation

## 2023-07-24 DIAGNOSIS — M25511 Pain in right shoulder: Secondary | ICD-10-CM | POA: Diagnosis not present

## 2023-07-24 DIAGNOSIS — F418 Other specified anxiety disorders: Secondary | ICD-10-CM

## 2023-07-24 DIAGNOSIS — G8929 Other chronic pain: Secondary | ICD-10-CM | POA: Diagnosis not present

## 2023-07-24 MED ORDER — PANTOPRAZOLE SODIUM 40 MG PO TBEC: 40.0000 mg | DELAYED_RELEASE_TABLET | Freq: Every day | ORAL | 3 refills | Status: DC

## 2023-07-24 MED ORDER — CITALOPRAM HYDROBROMIDE 10 MG PO TABS: 10.0000 mg | ORAL_TABLET | Freq: Every day | ORAL | 3 refills | Status: DC

## 2023-07-24 NOTE — Telephone Encounter (Signed)
 Chief Complaint: sadness Symptoms: just wants to sleep, difficulty wanting to perform ADLs Frequency: ongoing, pt feels getting worse Pertinent Negatives: Patient denies SI/HI, therapist/counselor, physical s/s at this time Disposition: [] ED /[] Urgent Care (no appt availability in office) / [x] Appointment(In office/virtual)/ []  [facility]/ [] Home Care/ [] Refused Recommended Disposition /[] [facility]/ []  Follow-up with PCP Additional Notes: Pt states that in the past she has been on buspirone. Pt states that she was never really given a dx. Pt states that she has recently just been wanting to sleep, not wanting to complete ADLs at this time, states she does still complete them and go to work. No SI/HI at this time. Pt states that she is not having physical s/s at this time, however pt states that recently she feels like she has been having "panic attacks". Pt scheduled for today.  Copied from CRM [id number]. Topic: Clinical - Red Word Triage >> Jul 24, 2023  7:40 AM Essie A wrote: Red Word that prompted transfer to Nurse Triage: Anxiety, depression, panic attack Reason for Disposition  [1] Depression AND [2] worsening (e.g., sleeping poorly, less able to do activities of daily living)  Answer Assessment - Initial Assessment Questions 1. CONCERN: "What happened that made you call today?"     Last night had about 4-5 episodes of "panic attack" last evening 2. DEPRESSION SYMPTOM SCREENING: "How are you feeling overall?" (e.g., decreased energy, increased sleeping or difficulty sleeping, difficulty concentrating, feelings of sadness, guilt, hopelessness, or worthlessness)     Just want to sleep 3. RISK OF HARM - SUICIDAL IDEATION:  "Do you ever have thoughts of hurting or killing yourself?"  (e.g., yes, no, no but preoccupation with thoughts about death)   - INTENT:  "Do you have thoughts of hurting or killing yourself right NOW?" (e.g., yes, no, N/A)   - PLAN: "Do you have a  specific plan for how you would do this?" (e.g., gun, knife, overdose, no plan, N/A)     denies 4. RISK OF HARM - HOMICIDAL IDEATION:  "Do you ever have thoughts of hurting or killing someone else?"  (e.g., yes, no, no but preoccupation with thoughts about death)   - INTENT:  "Do you have thoughts of hurting or killing someone right NOW?" (e.g., yes, no, N/A)   - PLAN: "Do you have a specific plan for how you would do this?" (e.g., gun, knife, no plan, N/A)      denies 5. FUNCTIONAL IMPAIRMENT: "How have things been going for you overall? Have you had more difficulty than usual doing your normal daily activities?"  (e.g., better, same, worse; self-care, school, work, interactions)     Yes more difficulty  6. SUPPORT: "Who is with you now?" "Who do you live with?" "Do you have family or friends who you can talk to?"      Yes has family, but does not feel like husband is supportive 7. THERAPIST: "Do you have a counselor or therapist? Name?"     denies 8. STRESSORS: "Has there been any new stress or recent changes in your life?"     denies 9. ALCOHOL USE OR SUBSTANCE USE (DRUG USE): "Do you drink alcohol or use any illegal drugs?"     denies 10. OTHER: "Do you have any other physical symptoms right now?" (e.g., fever)       Pt states that she has been having a lump in her throat, and states that sometimes it feels like food gets stuck in throat.  11. PREGNANCY: "Is there any chance you are pregnant?" "When was your last menstrual period?"       denies  Protocols used: Depression-A-AH

## 2023-07-24 NOTE — Progress Notes (Signed)
 Subjective:  Patient ID: Yvonne Crawford, female    DOB: 30-May-1990  Age: 33 y.o. MRN: [id number]  Chief Complaint  Patient presents with   Depression   Anxiety    HPI  Pt in today states she has had issues with GERD and sensation of something stuck in her throat.  She does use TUMS which helps her stomach but feels the throat sensation stems from anxiety  Pt states she has 'health anxiety' - she worries a lot about cancer and general illnesses. She was a smoker but trying to quit  Pt complains of right shoulder and upper back soreness - does work at home on computer    07/24/2023    2:39 PM 05/03/2022    3:28 PM  Depression screen PHQ 2/9  Decreased Interest 3 0  Down, Depressed, Hopeless 3 0  PHQ - 2 Score 6 0  Altered sleeping 1   Tired, decreased energy 3   Change in appetite 3   Feeling bad or failure about yourself  2   Trouble concentrating 3   Moving slowly or fidgety/restless 0   Suicidal thoughts 0   PHQ-9 Score 18   Difficult doing work/chores Extremely dIfficult         01/13/2019    2:00 PM 01/13/2019   10:00 PM 01/14/2019   11:44 AM 05/03/2022    3:28 PM 07/24/2023    2:39 PM  Fall Risk  Falls in the past year?    0 0  Was there an injury with Fall?    0 0  Fall Risk Category Calculator    0 0  (RETIRED) Patient Fall Risk Level Low fall risk Low fall risk Moderate fall risk    Patient at Risk for Falls Due to    No Fall Risks No Fall Risks  Fall risk Follow up    Falls evaluation completed Falls evaluation completed     ROS CONSTITUTIONAL: Negative for chills, fatigue, fever,  E/N/T: Negative for ear pain, nasal congestion and sore throat.  CARDIOVASCULAR: Negative for chest pain, dizziness, palpitations and pedal edema.  RESPIRATORY: Negative for recent cough and dyspnea.  GASTROINTESTINAL: see HPI MSK: see HPI INTEGUMENTARY: Negative for rash.  NEUROLOGICAL: Negative for dizziness and headaches.  PSYCHIATRIC: see HPI   Current Outpatient  Medications:    citalopram  (CELEXA ) 10 MG tablet, Take 1 tablet (10 mg total) by mouth daily., Disp: 30 tablet, Rfl: 3   norethindrone-ethinyl estradiol-FE (LOESTRIN FE) 1-20 MG-MCG tablet, Take 1 tablet by mouth daily., Disp: , Rfl:    pantoprazole  (PROTONIX ) 40 MG tablet, Take 1 tablet (40 mg total) by mouth daily., Disp: 30 tablet, Rfl: 3  Past Medical History:  Diagnosis Date   Allergy    Anxiety    Depression    Dysrhythmia    GERD (gastroesophageal reflux disease)    History of pneumonia    Pneumonia    history   Sinus arrhythmia    Objective:  PHYSICAL EXAM:   BP 102/66   Pulse 74   Temp 98 F (36.7 C) (Temporal)   Resp 18   Ht 5\' 7"  (1.702 m)   Wt 211 lb 12.8 oz (96.1 kg)   SpO2 97%   BMI 33.17 kg/m    GEN: Well nourished, well developed, in no acute distress   Cardiac: RRR; no murmurs, rubs, or gallops,no edema -  Respiratory:  normal respiratory rate and pattern with no distress - normal breath sounds with no rales, rhonchi, wheezes  or rubs  MS: no deformity or atrophy - tenderness to right shoulder and upper back Skin: warm and dry, no rash  Neuro:  Alert and Oriented x 3, - CN II-Xii grossly intact Psych: euthymic mood, appropriate affect and demeanor  Assessment & Plan:    Gastroesophageal reflux disease without esophagitis -     Pantoprazole  Sodium; Take 1 tablet (40 mg total) by mouth daily.  Dispense: 30 tablet; Refill: 3  Anxiety with depression -     Citalopram  Hydrobromide; Take 1 tablet (10 mg total) by mouth daily.  Dispense: 30 tablet; Refill: 3  Chronic right shoulder pain Rom exercises given    Follow-up: Return in about 4 weeks (around 08/21/2023) for chronic fasting follow-up.  An After Visit Summary was printed and given to the patient.  Anthonette Bastos [facility] [phone]

## 2023-07-25 ENCOUNTER — Encounter: Payer: Self-pay | Admitting: Physician Assistant

## 2023-08-06 ENCOUNTER — Encounter: Payer: Self-pay | Admitting: Physician Assistant

## 2023-08-21 ENCOUNTER — Ambulatory Visit: Admitting: Physician Assistant

## 2023-08-21 ENCOUNTER — Encounter: Payer: Self-pay | Admitting: Physician Assistant

## 2023-08-21 VITALS — BP 100/68 | HR 80 | Temp 98.3°F | Resp 18 | Ht 67.0 in | Wt 206.0 lb

## 2023-08-21 DIAGNOSIS — K219 Gastro-esophageal reflux disease without esophagitis: Secondary | ICD-10-CM

## 2023-08-21 DIAGNOSIS — F418 Other specified anxiety disorders: Secondary | ICD-10-CM | POA: Diagnosis not present

## 2023-08-21 DIAGNOSIS — E782 Mixed hyperlipidemia: Secondary | ICD-10-CM | POA: Diagnosis not present

## 2023-08-21 NOTE — Progress Notes (Signed)
 Subjective:  Patient ID: Yvonne Crawford, female    DOB: 22-Feb-1991  Age: 33 y.o. MRN: [id number]  Chief Complaint  Patient presents with   Medical Management of Chronic Issues    HPI Pt in for follow up of GERD - she states that she is doing well on protonix  40mg  and not having any further swallowing issues She did make her own appt with GI and [facility] will be doing endoscopy and colonoscopy next week  Pt is doing well on celexa  10mg  qd for anxiety with depression - she is not having any issues or concerns today Tolerating medication well  Pt with hyperlipidemia - is overdue for labwork - currently not taking medication but does try to watch her diet     08/21/2023   11:15 AM 07/24/2023    2:39 PM 05/03/2022    3:28 PM  Depression screen PHQ 2/9  Decreased Interest 0 3 0  Down, Depressed, Hopeless 0 3 0  PHQ - 2 Score 0 6 0  Altered sleeping 0 1   Tired, decreased energy 0 3   Change in appetite 0 3   Feeling bad or failure about yourself  0 2   Trouble concentrating 0 3   Moving slowly or fidgety/restless 0 0   Suicidal thoughts 0 0   PHQ-9 Score 0 18   Difficult doing work/chores Not difficult at all Extremely dIfficult         01/13/2019    2:00 PM 01/13/2019   10:00 PM 01/14/2019   11:44 AM 05/03/2022    3:28 PM 07/24/2023    2:39 PM  Fall Risk  Falls in the past year?    0 0  Was there an injury with Fall?    0 0  Fall Risk Category Calculator    0 0  (RETIRED) Patient Fall Risk Level Low fall risk  Low fall risk  Moderate fall risk     Patient at Risk for Falls Due to    No Fall Risks No Fall Risks  Fall risk Follow up    Falls evaluation completed Falls evaluation completed     Data saved with a previous flowsheet row definition     ROS CONSTITUTIONAL: Negative for chills, fatigue, fever,  E/N/T: Negative for ear pain, nasal congestion and sore throat.  CARDIOVASCULAR: Negative for chest pain, dizziness, palpitations and pedal edema.   RESPIRATORY: Negative for recent cough and dyspnea.  GASTROINTESTINAL: see HPI  PSYCHIATRIC: see HPI   Current Outpatient Medications:    citalopram  (CELEXA ) 10 MG tablet, Take 1 tablet (10 mg total) by mouth daily., Disp: 30 tablet, Rfl: 3   pantoprazole  (PROTONIX ) 40 MG tablet, Take 1 tablet (40 mg total) by mouth daily., Disp: 30 tablet, Rfl: 3  Past Medical History:  Diagnosis Date   Allergy    Anxiety    Depression    Dysrhythmia    GERD (gastroesophageal reflux disease)    History of pneumonia    Pneumonia    history   Sinus arrhythmia    Objective:  PHYSICAL EXAM:   BP 100/68   Pulse 80   Temp 98.3 F (36.8 C) (Temporal)   Resp 18   Ht 5' 7 (1.702 m)   Wt 206 lb (93.4 kg)   LMP 08/17/2023   SpO2 97%   BMI 32.26 kg/m    GEN: Well nourished, well developed, in no acute distress   Cardiac: RRR; no murmurs, rubs, Respiratory:  normal respiratory rate  and pattern with no distress - normal breath sounds with no rales, rhonchi, wheezes or rubs  Skin: warm and dry, no rash  Neuro:  Alert and Oriented x 3, - CN II-Xii grossly intact Psych: euthymic mood, appropriate affect and demeanor  Assessment & Plan:    Gastroesophageal reflux disease without esophagitis Continue protonix  Anxiety with depression -     TSH Continue celexa  Mixed hyperlipidemia -     CBC with Differential/Platelet -     Comprehensive metabolic panel with GFR -     Lipid panel Watch diet    Follow-up: Return in about 3 months (around 11/21/2023) for follow-up.  An After Visit Summary was printed and given to the patient.  Anthonette Bastos [facility] [phone]

## 2023-08-22 ENCOUNTER — Ambulatory Visit: Payer: Self-pay | Admitting: Physician Assistant

## 2023-08-22 LAB — COMPREHENSIVE METABOLIC PANEL WITH GFR
ALT: 31 IU/L (ref 0–32)
AST: 19 IU/L (ref 0–40)
Albumin: 4.4 g/dL (ref 3.9–4.9)
Alkaline Phosphatase: 89 IU/L (ref 44–121)
BUN/Creatinine Ratio: 9 (ref 9–23)
BUN: 6 mg/dL (ref 6–20)
Bilirubin Total: 0.5 mg/dL (ref 0.0–1.2)
CO2: 20 mmol/L (ref 20–29)
Calcium: 9.8 mg/dL (ref 8.7–10.2)
Chloride: 105 mmol/L (ref 96–106)
Creatinine, Ser: 0.66 mg/dL (ref 0.57–1.00)
Globulin, Total: 2.1 g/dL (ref 1.5–4.5)
Glucose: 88 mg/dL (ref 70–99)
Potassium: 4.6 mmol/L (ref 3.5–5.2)
Sodium: 140 mmol/L (ref 134–144)
Total Protein: 6.5 g/dL (ref 6.0–8.5)
eGFR: 118 mL/min/{1.73_m2} (ref >59)

## 2023-08-22 LAB — CBC WITH DIFFERENTIAL/PLATELET
Basophils Absolute: 0.1 10*3/uL (ref 0.0–0.2)
Basos: 1 %
EOS (ABSOLUTE): 0.2 10*3/uL (ref 0.0–0.4)
Eos: 2 %
Hematocrit: 43.1 % (ref 34.0–46.6)
Hemoglobin: 13.9 g/dL (ref 11.1–15.9)
Immature Grans (Abs): 0 10*3/uL (ref 0.0–0.1)
Immature Granulocytes: 0 %
Lymphocytes Absolute: 4 10*3/uL — ABNORMAL HIGH (ref 0.7–3.1)
Lymphs: 35 %
MCH: 30 pg (ref 26.6–33.0)
MCHC: 32.3 g/dL (ref 31.5–35.7)
MCV: 93 fL (ref 79–97)
Monocytes Absolute: 0.5 10*3/uL (ref 0.1–0.9)
Monocytes: 4 %
Neutrophils Absolute: 6.6 10*3/uL (ref 1.4–7.0)
Neutrophils: 58 %
Platelets: 327 10*3/uL (ref 150–450)
RBC: 4.63 x10E6/uL (ref 3.77–5.28)
RDW: 13 % (ref 11.7–15.4)
WBC: 11.4 10*3/uL — ABNORMAL HIGH (ref 3.4–10.8)

## 2023-08-22 LAB — LIPID PANEL
Chol/HDL Ratio: 5.3 ratio — ABNORMAL HIGH (ref 0.0–4.4)
Cholesterol, Total: 185 mg/dL (ref 100–199)
HDL: 35 mg/dL — ABNORMAL LOW (ref >39)
LDL Chol Calc (NIH): 118 mg/dL — ABNORMAL HIGH (ref 0–99)
Triglycerides: 179 mg/dL — ABNORMAL HIGH (ref 0–149)
VLDL Cholesterol Cal: 32 mg/dL (ref 5–40)

## 2023-08-22 LAB — TSH: TSH: 0.922 u[IU]/mL (ref 0.450–4.500)

## 2023-11-16 ENCOUNTER — Other Ambulatory Visit: Payer: Self-pay | Admitting: Physician Assistant

## 2023-11-16 DIAGNOSIS — K219 Gastro-esophageal reflux disease without esophagitis: Secondary | ICD-10-CM

## 2023-11-16 DIAGNOSIS — F418 Other specified anxiety disorders: Secondary | ICD-10-CM

## 2023-11-22 ENCOUNTER — Ambulatory Visit: Admitting: Physician Assistant

## 2023-12-19 ENCOUNTER — Other Ambulatory Visit: Payer: Self-pay | Admitting: Physician Assistant

## 2023-12-19 ENCOUNTER — Ambulatory Visit: Admitting: Physician Assistant

## 2023-12-19 ENCOUNTER — Encounter: Payer: Self-pay | Admitting: Physician Assistant

## 2023-12-19 VITALS — BP 108/60 | HR 87 | Temp 97.5°F | Ht 67.0 in | Wt 223.2 lb

## 2023-12-19 DIAGNOSIS — G4730 Sleep apnea, unspecified: Secondary | ICD-10-CM | POA: Diagnosis not present

## 2023-12-19 DIAGNOSIS — R0683 Snoring: Secondary | ICD-10-CM

## 2023-12-19 DIAGNOSIS — F418 Other specified anxiety disorders: Secondary | ICD-10-CM

## 2023-12-19 DIAGNOSIS — K219 Gastro-esophageal reflux disease without esophagitis: Secondary | ICD-10-CM

## 2023-12-19 MED ORDER — PANTOPRAZOLE SODIUM 40 MG PO TBEC: 40.0000 mg | DELAYED_RELEASE_TABLET | Freq: Every day | ORAL | 1 refills | Status: AC

## 2023-12-19 MED ORDER — CITALOPRAM HYDROBROMIDE 10 MG PO TABS: 10.0000 mg | ORAL_TABLET | Freq: Every day | ORAL | 1 refills | Status: AC

## 2023-12-19 NOTE — Telephone Encounter (Signed)
 Please call pt - overdue for follow up

## 2023-12-19 NOTE — Progress Notes (Signed)
 Subjective:  Patient ID: Yvonne Crawford, female    DOB: February 16, 1991  Age: 33 y.o. MRN: [id number]  Chief Complaint  Patient presents with   Medical Management of Chronic Issues    HPI Pt in for follow up of GERD - she states that she is doing well on protonix  40mg  - she did have endocsopy and colonoscopy since last visit - was diagnosed with hiatal hernia  Pt is doing well on celexa  10mg  qd for anxiety with depression - she is not having any issues or concerns today Tolerating medication well  Pt would like to be referred to sleep clinic - has issues with snoring, waking up tired and episodes per husband of stopping breathing in sleep     12/19/2023    3:19 PM 08/21/2023   11:15 AM 07/24/2023    2:39 PM 05/03/2022    3:28 PM  Depression screen PHQ 2/9  Decreased Interest 1 0 3 0  Down, Depressed, Hopeless 1 0 3 0  PHQ - 2 Score 2 0 6 0  Altered sleeping 1 0 1   Tired, decreased energy 3 0 3   Change in appetite 0 0 3   Feeling bad or failure about yourself  0 0 2   Trouble concentrating 1 0 3   Moving slowly or fidgety/restless 0 0 0   Suicidal thoughts 0 0 0   PHQ-9 Score 7 0 18   Difficult doing work/chores Not difficult at all Not difficult at all Extremely dIfficult         01/13/2019   10:00 PM 01/14/2019   11:44 AM 05/03/2022    3:28 PM 07/24/2023    2:39 PM 12/19/2023    3:19 PM  Fall Risk  Falls in the past year?   0 0 0  Was there an injury with Fall?   0 0 0  Fall Risk Category Calculator   0 0 0  (RETIRED) Patient Fall Risk Level Low fall risk  Moderate fall risk      Patient at Risk for Falls Due to   No Fall Risks No Fall Risks No Fall Risks  Fall risk Follow up   Falls evaluation completed Falls evaluation completed Falls evaluation completed     Data saved with a previous flowsheet row definition    CONSTITUTIONAL: Negative for chills, fatigue,  CARDIOVASCULAR: Negative for chest pain, dizziness, palpitations and pedal edema.  RESPIRATORY: Negative  for recent cough and dyspnea.  GASTROINTESTINAL: Negative for abdominal pain, acid reflux symptoms, constipation, diarrhea, nausea and vomiting.   PSYCHIATRIC: Negative for sleep disturbance and to question depression screen.  Negative for depression, negative for anhedonia.        Current Outpatient Medications:    citalopram  (CELEXA ) 10 MG tablet, Take 1 tablet (10 mg total) by mouth daily., Disp: 90 tablet, Rfl: 1   pantoprazole  (PROTONIX ) 40 MG tablet, Take 1 tablet (40 mg total) by mouth daily., Disp: 90 tablet, Rfl: 1  Past Medical History:  Diagnosis Date   Allergy    Anxiety    Depression    Dysrhythmia    GERD (gastroesophageal reflux disease)    History of pneumonia    Pneumonia    history   Sinus arrhythmia    Objective:  PHYSICAL EXAM:   VS: BP 108/60   Pulse 87   Temp (!) 97.5 F (36.4 C)   Ht 5' 7 (1.702 m)   Wt 223 lb 3.2 oz (101.2 kg)   SpO2 98%  BMI 34.96 kg/m   GEN: Well nourished, well developed, in no acute distress   Cardiac: RRR; no murmurs, rubs, or gallops,no edema -  Respiratory:  normal respiratory rate and pattern with no distress - normal breath sounds with no rales, rhonchi, wheezes or rubs MS: no deformity or atrophy  Skin: warm and dry, no rash  Neuro:  Alert and Oriented x 3,  - CN II-Xii grossly intact Psych: euthymic mood, appropriate affect and demeanor   Assessment & Plan:    Gastroesophageal reflux disease without esophagitis Continue protonix  Anxiety with depression  Continue celexa  Snoring/sleep apnea Refer for sleep study    Follow-up: Return in about 6 months (around 06/18/2024) for fasting physical.  An After Visit Summary was printed and given to the patient.  [name] [facility] [phone]

## 2024-01-02 ENCOUNTER — Telehealth: Payer: Self-pay | Admitting: Physician Assistant

## 2024-01-02 NOTE — Telephone Encounter (Signed)
 Called and left a voicemail as well as sent a MyChart message for patient to call the office to reschedule the appointment for 06/21/24 due to the provider being out of office.

## 2024-06-21 ENCOUNTER — Encounter: Admitting: Physician Assistant
